# Patient Record
Sex: Male | Born: 1935 | Race: White | Hispanic: No | Smoking: Former smoker
Health system: Southern US, Community
[De-identification: ages and names within clinical notes are randomized; demographics above are authoritative.]

## PROBLEM LIST (undated history)

## (undated) DIAGNOSIS — E785 Hyperlipidemia, unspecified: Secondary | ICD-10-CM

## (undated) DIAGNOSIS — J449 Chronic obstructive pulmonary disease, unspecified: Secondary | ICD-10-CM

## (undated) DIAGNOSIS — I10 Essential (primary) hypertension: Secondary | ICD-10-CM

## (undated) DIAGNOSIS — E782 Mixed hyperlipidemia: Secondary | ICD-10-CM

## (undated) DIAGNOSIS — Z720 Tobacco use: Secondary | ICD-10-CM

## (undated) DIAGNOSIS — I714 Abdominal aortic aneurysm, without rupture, unspecified: Secondary | ICD-10-CM

## (undated) DIAGNOSIS — I2510 Atherosclerotic heart disease of native coronary artery without angina pectoris: Secondary | ICD-10-CM

## (undated) HISTORY — DX: Hyperlipidemia, unspecified: E78.5

## (undated) HISTORY — DX: Abdominal aortic aneurysm, without rupture, unspecified: I71.40

## (undated) HISTORY — DX: Chronic obstructive pulmonary disease, unspecified: J44.9

## (undated) HISTORY — DX: Essential (primary) hypertension: I10

## (undated) HISTORY — DX: Atherosclerotic heart disease of native coronary artery without angina pectoris: I25.10

## (undated) HISTORY — PX: TONSILLECTOMY: SUR1361

## (undated) HISTORY — DX: Mixed hyperlipidemia: E78.2

## (undated) HISTORY — DX: Abdominal aortic aneurysm, without rupture: I71.4

## (undated) HISTORY — DX: Tobacco use: Z72.0

---

## 1967-07-11 HISTORY — PX: HERNIA REPAIR: SHX51

## 2000-11-09 ENCOUNTER — Ambulatory Visit (HOSPITAL_COMMUNITY): Admission: RE | Admit: 2000-11-09 | Discharge: 2000-11-09 | Payer: Self-pay | Admitting: Cardiology

## 2000-11-09 ENCOUNTER — Encounter: Payer: Self-pay | Admitting: Cardiology

## 2000-11-09 HISTORY — PX: CORONARY ARTERY BYPASS GRAFT: SHX141

## 2000-11-13 ENCOUNTER — Encounter: Payer: Self-pay | Admitting: Thoracic Surgery (Cardiothoracic Vascular Surgery)

## 2000-11-13 ENCOUNTER — Inpatient Hospital Stay (HOSPITAL_COMMUNITY): Admission: AD | Admit: 2000-11-13 | Discharge: 2000-11-16 | Payer: Self-pay | Admitting: Cardiology

## 2000-11-14 ENCOUNTER — Encounter: Payer: Self-pay | Admitting: Thoracic Surgery (Cardiothoracic Vascular Surgery)

## 2000-11-15 ENCOUNTER — Encounter: Payer: Self-pay | Admitting: Thoracic Surgery (Cardiothoracic Vascular Surgery)

## 2000-11-28 ENCOUNTER — Emergency Department (HOSPITAL_COMMUNITY): Admission: EM | Admit: 2000-11-28 | Discharge: 2000-11-28 | Payer: Self-pay | Admitting: *Deleted

## 2000-11-28 ENCOUNTER — Encounter: Payer: Self-pay | Admitting: *Deleted

## 2000-12-05 ENCOUNTER — Ambulatory Visit (HOSPITAL_COMMUNITY): Admission: RE | Admit: 2000-12-05 | Discharge: 2000-12-05 | Payer: Self-pay | Admitting: Cardiology

## 2000-12-05 ENCOUNTER — Encounter: Payer: Self-pay | Admitting: Cardiology

## 2000-12-06 ENCOUNTER — Encounter (HOSPITAL_COMMUNITY): Admission: RE | Admit: 2000-12-06 | Discharge: 2001-01-05 | Payer: Self-pay | Admitting: Cardiology

## 2001-01-07 ENCOUNTER — Encounter (HOSPITAL_COMMUNITY): Admission: RE | Admit: 2001-01-07 | Discharge: 2001-02-06 | Payer: Self-pay | Admitting: Cardiology

## 2003-07-08 ENCOUNTER — Emergency Department (HOSPITAL_COMMUNITY): Admission: EM | Admit: 2003-07-08 | Discharge: 2003-07-08 | Payer: Self-pay | Admitting: Internal Medicine

## 2008-03-12 ENCOUNTER — Emergency Department (HOSPITAL_COMMUNITY): Admission: EM | Admit: 2008-03-12 | Discharge: 2008-03-12 | Payer: Self-pay | Admitting: Emergency Medicine

## 2009-10-12 ENCOUNTER — Encounter: Payer: Self-pay | Admitting: Gastroenterology

## 2009-11-08 ENCOUNTER — Ambulatory Visit: Payer: Self-pay | Admitting: Gastroenterology

## 2009-11-08 ENCOUNTER — Ambulatory Visit (HOSPITAL_COMMUNITY): Admission: RE | Admit: 2009-11-08 | Discharge: 2009-11-08 | Payer: Self-pay | Admitting: Gastroenterology

## 2009-11-10 ENCOUNTER — Telehealth (INDEPENDENT_AMBULATORY_CARE_PROVIDER_SITE_OTHER): Payer: Self-pay

## 2010-08-09 NOTE — Progress Notes (Signed)
Summary: phone note/ pt having HA, nausea, fever and weakness  Phone Note Call from Patient   Caller: Patient Summary of Call: Pt called to say he had TCS on 11/08/2009. He thinks he is having a reaction ???. He is having real bad headaches, nausea and  fever and weakness. I told him it sound like he is having flu/virus symptoms, but i would let Dr. Darrick Penna know. (He didn't have  polyps). Please advise. Initial call taken by: Cloria Spring LPN,  Nov 10, 1608 10:31 AM     Appended Document: phone note/ pt having HA, nausea, fever and weakness Called pt. Feels like he has the flu: HA, nausea off & on, subjective chills, abd pain mid abd pain, no diarrhea, no blood in stool. Mild abd pain on MON and then had other symptoms. Feels weak. BM: none since MON. Drinking fluids. Take Tylenol ATC reg 2 q6h for 2 days. Diet instruction given. Feels a little better. Pt will call FRi AM for update. If Sx worsen pt instructed to call me tomorrow.

## 2010-08-09 NOTE — Letter (Signed)
Summary: TRIAGE ORDER  TRIAGE ORDER   Imported By: Ave Filter 10/12/2009 16:47:08  _____________________________________________________________________  External Attachment:    Type:   Image     Comment:   External Document

## 2010-11-25 NOTE — Discharge Summary (Signed)
[place]  Patient:    Ricardo Castillo, Ricardo Castillo                          MRN: [id number] Adm. Date:  [id number] Disc. Date: [id number] Attending:  Tressie Stalker Dictator:   Carlye Grippe. CC:         [name], M.D.  [name], M.D.   Discharge Summary  HISTORY OF PRESENT ILLNESS:  This is a 75 year old male from [place], [place], with no previous cardiac history, however, several risk factors, including hyperlipidemia and previous history of tobacco abuse, who was initially referred to [name], M.D., with a one-year history of progressive angina and an abnormal electrocardiogram showing subtle ST segment changes in multiple leads.  He subsequently underwent an exercise treadmill test on Nov 07, 2000, which was positive for ST segment depression in the anterolateral leads and inferior leads with associated symptoms of chest pain during the study.  The patient was initially admitted for elective cardiac catheterization and further evaluation and treatment as dictated.  PAST MEDICAL HISTORY:  Heavy alcohol abuse in the past, but quit drinking more than 12 years ago.  He quit smoking 11 years ago.  He does have hyperlipidemia.  PAST SURGICAL HISTORY:  Previous hernia repair which was complicated by bleeding and subsequent infarction of the left testicle.  This was performed in 1969.  MEDICATIONS PRIOR TO THIS ADMISSION: 1. Toprol XL 25 mg q.d. 2. Baby aspirin one q.d.  ALLERGIES:  None known.  FAMILY HISTORY:  Please see the history and physical done at the time of admission.  SOCIAL HISTORY:  Please see the history and physical done at the time of admission.  REVIEW OF SYSTEMS:  Please see the history and physical done at the time of admission.  PHYSICAL EXAMINATION:  Please see the history and physical done at the time of admission.  HOSPITAL COURSE:  The patient was admitted for cardiac catheterization  and this demonstrated severe three-vessel disease with normal left ventricular function.  He was referred to [facility]. Cornelius Moras, M.D., for elective coronary revascularization.  Dr. Cornelius Moras evaluated the patient and his studies and agreed to proceed with the procedure.  The patient had a leave of absence and was readmitted on Nov 13, 2000, and he underwent the following procedure:  Coronary artery bypass grafting x 4.  The following grafts were placed:  1. Left internal mammary artery to the LAD.  2. Saphenous vein graft to the diagonal. 3. Saphenous vein graft to the first circumflex marginal branch.  4. Saphenous vein graft to the left posterolateral coronary artery.  The cross clamp time was 58 minutes and pump time 76 minutes.  The patient tolerated the procedure well and was taken to the surgical intensive care unit in stable condition.  POSTOPERATIVE HOSPITAL COURSE:  The patient has done well.  He has maintained excellent hemodynamics.  He was weaned from the ventilator without difficulty. All routine lines, monitors, and drainage tubes were discontinued in a routine stepwise manner.  The patient was advanced in a routine manner in regards to cardiac rehabilitation phase 1 modalities.  The patient has maintained normal sinus rhythm.  The patient has maintained good oxygen saturations and oxygen has been weaned off.  The patient has tolerated a gentle diuresis.  The patients incisions are healing well without signs of infection.  Overall he is felt to be quite stable for tentative discharge in  the morning on Nov 16, 2000, pending morning round re-evaluation.  MEDICATIONS ON DISCHARGE: 1. Toprol XL 25 mg q.d. 2. Aspirin 325 mg q.d. 3. Tylox one to two q.4-6h. p.r.n. as needed for pain. 4. Multivitamins one daily.  FINAL DIAGNOSES: 1. Coronary artery disease with class III angina, progressive three-vessel    disease by cardiac catheterization with normal left ventricular function. 2.  Remote tobacco abuse. 3. Remote alcohol abuse. 4. Previous left inguinal hernia repair with complications as noted above. 5. Hyperlipidemia. 6. Postoperative anemia.  The most recent hemoglobin and hematocrit dated    Nov 15, 2000, were 8.5 and 25.5, respectively, and this will be repeated    prior to discharge to make sure as to its stability.  DISCHARGE INSTRUCTIONS:  The patient will receive written instructions in regard to medications, activity, diet, wound care, and follow-up.  Follow-up will include [name], M.D., in two weeks, [facility]. Cornelius Moras, M.D., on Monday, December 10, 2000, at 10:15 a.m.  CONDITION ON DISCHARGE:  Stable and improved. DD:  11/15/00 TD:  11/17/00 Job: [id number]

## 2010-11-25 NOTE — Consult Note (Signed)
[facility]. [facility]  Patient:    Ricardo Castillo, Ricardo Castillo                          MRN: [id number] Proc. Date: 11/09/00 Adm. Date:  [id number] Attending:  Ophelia Shoulder CC:         [name], M.D.  [name], M.D.  CVTS office   Consultation Report  REQUESTING PHYSICIAN:  [name], M.D.  PRIMARY CARE PHYSICIAN:  [name], M.D.  REASON FOR CONSULTATION:  Severe three vessel coronary artery disease.  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  He was initially referred to Dr. Elsie Lincoln with a one year history of progressive exertional angina and an abnormal electrocardiogram demonstrating subtle ST segment changes in multiple leads.  He subsequently underwent an exercise treadmill test on May 1 which was positive for ST segment depression in the anterolateral leads and inferior leads with associated symptoms of chest pain during the study.  The patient subsequently underwent elective cardiac catheterization today by Dr. Elsie Lincoln.  This demonstrates severe three vessel coronary artery disease with normal left ventricular function.  He is referred for elective coronary revascularization.  REVIEW OF SYSTEMS:  Ricardo Castillo report he had a syncopal episode of chest pain occurring at rest approximately three or four weeks ago which resolved within 15 minutes after he took some aspirin.  Otherwise, he explicitly denies any previous episodes of chest pain occurring at rest.  He reports no problems with nocturnal angina.  He has mild dyspnea on exertion and he gets short of breath when he has symptoms of chest pain.  He denies any resting shortness of breath, PND, orthopnea, or lower extremity edema.  He has occasional palpitations, but denies syncopal episodes.  The rest of  his review of systems is notable for normal appetite with normal bowel function.  He denies any fever, chills, or productive cough.  He reports no hematochezia, hematemesis, or melena.  He denies any problems with claudication or history of transient monocular blindness or transient numbness or weakness involving either upper or lower extremity.  The remainder of his review of systems is essentially unrevealing.  PAST MEDICAL HISTORY:  Notable for the absence of any previously documented history of hypertension, diabetes, stroke, coronary artery disease.  He was recently diagnosed with mild hyperlipidemia.  The patient has history of heavy alcohol abuse in the past but he quit drinking more than 12 years ago.  He quit smoking 11 years ago.  PAST SURGICAL HISTORY:  Notable for hernia repair in 1969 complicated by bleeding and subsequently by infarction of the left testicle.  FAMILY HISTORY:  Notable for the absence of early onset coronary artery disease.  SOCIAL HISTORY:  Patient lives alone, but has a daughter who is nearby and very supportive.  He is divorced.  He has a history of heavy tobacco abuse, smoking more than two packs of cigarettes for many years, although he quit smoking approximately 11 years ago.  He quit drinking completely 12 years ago.  MEDICATIONS:  Prior to his recent cardiac catheterization include:  Toprol XL 25 mg q.d., baby aspirin q.d.  ALLERGIES:  Denies.  No sensitivities.  PHYSICAL EXAMINATION  GENERAL:  Well appearing thin white male who appears his stated age in no acute distress.  SKIN:  Clean and dry throughout.  VITAL SIGNS:  He is normotensive in sinus rhythm by monitor.  HEENT:  Essentially within normal limits.  NECK:  Supple.  There is no cervical or supraclavicular lymphadenopathy. There is no jugular venous distention.  No carotid bruits are noted.  CHEST:  Clear and symmetrical breath sounds bilaterally.  No wheezes or rhonchi are  noted.  CARDIOVASCULAR:  Regular rate and rhythm.  No murmurs, rubs, or gallops are identified.  ABDOMEN:  Soft and nontender.  The liver edge is not palpable.  No masses are identified.  EXTREMITIES:  Warm and well perfused.  There is no lower extremity edema. Distal pulses are palpable throughout.  Examination of the left forearm and hand demonstrates palpable ulnar and radial pulse.  Capillary refill appears intact in the left hand when the left radial artery is occluded.  A pulse oximetry probe was placed on the patients left index finger and ______ wave form is identified.  During transient compression of the left radial artery the wave form diminishes slightly, but insignificantly.  NEUROLOGIC:  Grossly nonfocal and symmetrical throughout.  RECTAL:  Deferred.  GENITOURINARY:  Deferred.  LABORATORIES:  Cardiac catheterization films performed today are reviewed. These demonstrate severe three vessel coronary artery disease with normal left ventricular function.  Specifically, there is calcification of the left main coronary artery as well as the proximal left anterior descending coronary artery.  There is 60-70% proximal stenosis of the left anterior descending coronary artery.  There is 70-80% proximal stenosis of the left circumflex coronary artery with 100% occlusion of the first circumflex marginal branch. This vessel GHOSTS in with left-to-left collateral filling.  There is 95% stenosis of the mid left circumflex coronary artery arising just before a small second circumflex marginal branch.  There is a very large left posterolateral branch which comes off the distal left circumflex coronary artery.  There is left dominant coronary circulation.  There is 90% proximal stenosis in a very small nondominant right coronary artery.   Laboratory data prior to cardiac catheterization included white blood count 5000, hemoglobin 15.6%, hematocrit 46.7%, platelet count 205,000.   Basic metabolic panel includes sodium 143, potassium 4.5, chloride 103, bicarbonate 25, BUN 14, creatinine 1.0, glucose 103.  Coagulation profile is normal with prothrombin time 12.0, INR 0.9, and PTT of 32 seconds.  IMPRESSION:  Severe three vessel coronary artery disease with normal left ventricular function and class III unstable angina.  PLAN:  I have outlined at length the indications, risks, and potential benefits of coronary artery bypass grafting with Mr. Goldie and his daughter. We have discussed alternative forms of therapy.  They accept all associated risks of surgery including, but not limited to, risk of death, stroke, myocardial infarction, bleeding requiring blood transfusion, arrhythmia, infection, and recurrent coronary artery disease.  We have also discussed the risks and benefits of utilization of the left radial artery as a conduit for bypass grafting.  Associated risks of this procedure are discussed.  All their questions have been addressed.  We tentatively plan to proceed with surgery on Tuesday, May 7. DD:  11/09/00 TD:  11/11/00 Job: [id number]

## 2010-11-25 NOTE — Cardiovascular Report (Signed)
[place]. [facility]  Patient:    Ricardo Castillo, Ricardo Castillo                          MRN: [id number] Proc. Date: 11/09/00 Adm. Date:  [id number] Attending:  Ophelia Shoulder                        Cardiac Catheterization  PROCEDURES PERFORMED: 1. Selective coronary angiography by Judkins technique. 2. Retrograde left heart catheterization. 3. Left ventricular angiography. 4. Abdominal aortography. 5. Selective visualization of an unbypassed internal mammary artery.  COMPLICATIONS:  None.  ENTRY SITE:  Right femoral artery.  DYE USED:  Omnipaque.  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  Two days ago, he presented to our [place] office for treadmill testing and within the second minute of exercise had mild chest discomfort and 2 mm of horizontal ST depression in his inferior and anterolateral leads.  The patient had quick resolution of pain within the first or second minute of recovery.  His ST segments took about 12-13 minutes to normalize.  Today, the patient enters the cardiac catheterization lab on an outpatient basis electively.  RESULTS: 1. The left main coronary artery appears normal. 2. The left anterior descending coronary artery contains calcification in the    proximal and mid portion of the vessel.  A diagonal arises before a first    septal perforator branch.  At that point in the vessel, there is an area of    50-60% stenosis.  The distal vessel is of good caliber and is an excellent    distal target for bypass surgery.  The first diagonal branch is a    relatively small vessel but is potentially bypassable. 3. The circumflex coronary artery is dominant, giving rise to a posterior    descending branch as well as a posterolateral branch. 4. The proximal circumflex contains a 75% stenosis.  The mid circumflex before    the  first obtuse marginal branch has a 95% eccentric Type C lesion.  The    distal circumflex contains an 80% stenosis.  The ostium of the first obtuse    marginal branch contains a 90% ostial stenosis with the distal vessel being    bypassable.  The posterolateral, posterior descending branches are    bypassable.  There is a 100% occluded intermedius ramus branch that fills    in a retrograde fashion by LAD to retrograde ramus collateral flow. 5. The right coronary artery is nondominant.  It is basically occluded with    intracoronary collateral flow.  I do not see much that is bypassable in    the right coronary artery.  The left ventricle shows excellent contractility with an ejection fraction estimate of 70%.  No evidence of old infarction is noted.  The left subclavian is normal in appearance.  The IMA is of good quality.  The abdominal aorta shows 30-40% stenosis in the mid portion.  No aneurysm. The distal runoff is good.  Both renals appear normal.  FINAL DIAGNOSES: 1. Early positive treadmill test with chronic stable angina. 2. Severe three-vessel coronary artery disease    a. A 60% proximal left anterior descending artery stenosis.    b. Three lesions in sequence in the dominant circumflex coronary artery       as described above. 3.  Basically occluded right coronary artery with intracoronary collaterals. 4. A 100% occlusion of the intermedius ramus branch which has retrograde    collateral flow. 5. Normal left ventricular systolic function.  PLAN:  CVTS surgical consultation. DD:  11/09/00 TD:  11/11/00 Job: [id number]

## 2010-11-25 NOTE — Op Note (Signed)
[place]  Patient:    Ricardo Castillo, Ricardo Castillo                          MRN: [id number] Proc. Date: 11/13/00 Adm. Date:  [id number] Attending:  Tressie Stalker CC:         [name], M.D.  Dr. [name]   Operative Report  PREOPERATIVE DIAGNOSIS:  Severe three-vessel coronary artery disease with class III unstable angina.  POSTOPERATIVE DIAGNOSIS:  Sever three-vessel coronary artery disease with class III unstable angina.  PROCEDURE:  Median sternotomy for coronary artery bypass grafting x 4 (left internal mammary artery to distal left anterior descending coronary artery, saphenous vein graft to first diagonal branch, saphenous vein graft to first circumflex marginal branch, saphenous vein graft to left posterolateral branch).  SURGEON:  Salvatore Decent. Cornelius Moras, M.D.  ASSISTANT:  Areta Haber, P.A.  ANESTHESIA:  General.  BRIEF CLINICAL NOTE:  The patient is a 75 year old male followed by Dr. [name], with a history of hyperlipidemia and a previous history of tobacco use.  He presents with a one-year history of progressive symptoms of exertional angina.  He underwent an exercise treadmill test on May 1, which was positive.  Cardiac catheterization performed by Dr. Elsie Lincoln demonstrates severe three-vessel coronary artery disease with normal left ventricular function.  A full consultation note has been dictated previously (dictation [id number]).  OPERATIVE CONSENT:  The patient and his family are counseled at length regarding the indications and potential benefits of coronary artery bypass grafting.  They understand the associated risks of surgery, including but not limited to risk of death, stroke, myocardial infarction, bleeding requiring blood transfusion, arrhythmia, infection, and recurrent coronary artery disease.  They accept these risks as well as any unforeseen complications and desire to proceed with surgery as  described.  Initially discussions were additionally held regarding the possibility of using the left radial artery as conduit for bypass grafting.  However, preoperative noninvasive vascular examination demonstrates diminished perfusion of the left index finger as measured with plethysmography during compression of the radial artery.  Therefore, use of the radial artery is felt to be relatively contraindicated.  The patient is informed on the morning of surgery.  DESCRIPTION OF PROCEDURE:  The patient is brought to the operating room on the above-mentioned date, and invasive hemodynamic monitoring is established by the anesthesia service under the care and direction of Dr. Katrinka Blazing.  The patient is placed in the supine position on the operating table.  Intravenous antibiotics are administered.  Following induction of general endotracheal anesthesia, the patients chest, abdomen, both groins, and both lower extremities are prepared and draped in a sterile manner.  A median sternotomy incision is performed, and the left internal mammary artery is dissected from the chest wall and prepared for bypass grafting.  The left internal mammary artery is felt to be good-quality conduit. Simultaneously, saphenous vein is obtained from the patients right lower extremity through a series of longitudinal incisions.  The patients saphenous vein is felt to be good-quality conduit.  The patient is heparinized systemically.  The pericardium is opened.  The ascending aorta is normal in appearance.  The ascending aorta and the right atrial appendage are cannulated for cardiopulmonary bypass.  Adequate heparinization is verified.  Cardiopulmonary bypass is begun, and the surface of the heart is inspected. Distal sites are selected for coronary bypass grafting.  Of note, the patient has left-dominant coronary circulation.  The distal right  coronary artery is a very small vessel and does not give off any  branches which supply the left ventricle.  There are no branches that are felt to be suitable for bypass grafting off the distal right coronary artery.  Portions of saphenous vein and the left internal mammary artery are trimmed to appropriate lengths.  A temperature probe is placed in the left ventricular septum, and a Styrofoam pad is placed to protect the left phrenic nerve from thermal injury.  A cardioplegia catheter is placed in the ascending aorta.  The patient is cooled to 32 degrees systemic temperature.  The aortic crossclamp is applied.  Cardioplegia is delivered in antegrade fashion through the aortic root.  Iced saline slush is applied for topical hypothermia.  The initial cardioplegic arrest and myocardial cooling are felt to be excellent. Repeat doses of cardioplegia are administered intermittently throughout the crossclamp portion of the operation, both through the aortic root and down subsequently-placed vein grafts to maintain septal temperature below 15 degrees Centigrade.  The following distal coronary anastomoses are performed: (1) The posterolateral branch off the distal left circumflex coronary artery is grafted with a saphenous vein graft in an end-to-side fashion.  This coronary measures 1.7 mm in diameter and is of good quality.  (2) The first circumflex marginal branch is grafted with a saphenous vein graft in an end-to-side fashion.  This coronary measures 1.3 mm in diameter and is of fair to good quality.  (3) The first diagonal branch off the left anterior descending coronary artery is grafted with a saphenous vein graft in an end-to-side fashion.  This coronary measures 1.3 mm in diameter and is of good quality.  (4) _____ left anterior descending coronary artery is grafted with the left internal mammary artery in an end-to-side fashion.  This coronary measures 1.7 mm in diameter and is of good quality.  All three proximal saphenous vein anastomoses are  performed directly to the ascending aorta prior  to removal of the aortic crossclamp.  The septal temperature is noted to rise rapidly and dramatically upon reperfusion of the left internal mammary artery. The aortic crossclamp is removed after a total crossclamp time of 58 minutes.  The heart begins to beat spontaneously without need for cardioversion.  The patient is rewarmed to greater than 37 degrees Centigrade temperature.  All proximal and distal anastomoses are inspected for hemostasis and appropriate graft orientation.  Epicardial pacing wires are fixed at the right ventricular outflow tract and to the right atrial appendage.  The patient is weaned from cardiopulmonary bypass without difficulty.  The patients rhythm at separation from bypass is normal sinus rhythm.  No inotropic support is required.  Total cardiopulmonary bypass time for the operation is 76 minutes.  The venous and arterial cannulae are removed uneventfully.  Protamine is administered to reverse the anticoagulation.  The mediastinum and the left chest are irrigated with saline solution containing vancomycin.  Meticulous surgical hemostasis is ascertained.  The mediastinum and the left chest are drained with three chest tubes placed through separate stab incisions inferiorly.  The median sternotomy is closed in routine fashion.  The right lower extremity incisions are closed in multiple layers in routine fashion.  All skin incisions are closed with subcuticular skin closures.  The patient tolerated the procedure well and is transported to the surgical intensive care unit in stable condition.  There were no intraoperative complications.  All sponge and instrument counts are verified correct at completion of the operation.  The needle count  was incorrect, but an x-ray was obtained, and there was no sign of any retained foreign bodies.  No autologous blood products were administered. DD:  11/13/00 TD:   11/13/00 Job: [id number]

## 2012-11-28 ENCOUNTER — Telehealth: Payer: Self-pay | Admitting: Internal Medicine

## 2012-12-04 ENCOUNTER — Telehealth: Payer: Self-pay | Admitting: Internal Medicine

## 2012-12-12 ENCOUNTER — Ambulatory Visit: Payer: Self-pay | Admitting: Internal Medicine

## 2012-12-12 ENCOUNTER — Ambulatory Visit (INDEPENDENT_AMBULATORY_CARE_PROVIDER_SITE_OTHER): Payer: Medicare Other | Admitting: Internal Medicine

## 2012-12-12 ENCOUNTER — Encounter: Payer: Self-pay | Admitting: Internal Medicine

## 2012-12-12 VITALS — BP 140/78 | Ht 69.0 in | Wt 164.0 lb

## 2012-12-12 DIAGNOSIS — Z951 Presence of aortocoronary bypass graft: Secondary | ICD-10-CM

## 2012-12-12 DIAGNOSIS — I5189 Other ill-defined heart diseases: Secondary | ICD-10-CM

## 2012-12-12 DIAGNOSIS — I519 Heart disease, unspecified: Secondary | ICD-10-CM

## 2012-12-12 DIAGNOSIS — I10 Essential (primary) hypertension: Secondary | ICD-10-CM

## 2012-12-12 DIAGNOSIS — E785 Hyperlipidemia, unspecified: Secondary | ICD-10-CM

## 2012-12-12 DIAGNOSIS — R0609 Other forms of dyspnea: Secondary | ICD-10-CM

## 2012-12-12 DIAGNOSIS — I25810 Atherosclerosis of coronary artery bypass graft(s) without angina pectoris: Secondary | ICD-10-CM

## 2012-12-12 DIAGNOSIS — I714 Abdominal aortic aneurysm, without rupture: Secondary | ICD-10-CM | POA: Insufficient documentation

## 2012-12-12 DIAGNOSIS — I2510 Atherosclerotic heart disease of native coronary artery without angina pectoris: Secondary | ICD-10-CM

## 2012-12-12 NOTE — Progress Notes (Signed)
OFFICE NOTE  Chief Complaint:  Routine followup  Primary Care Physician: Catalina Pizza, MD  HPI:  Ricardo Castillo is a 77 year old gentleman with a history of coronary artery disease status post CABG, a LIMA to the LAD, vein graft to the OM and posterior lateral branch. He had a new systolic murmur, which seemed to be out of proportion to his recent echocardiogram in August 2012. I was concerned about a prior catheterization in 2002, which indicated a 30 to 40% abdominal aortic stenosis and wondered if there was a possible aneurysm or bruit causing this murmur. I did send him for a Doppler of the aorta, which did demonstrate a small infrarenal abdominal aortic aneurysm measuring 3.46 x 3.42 cm. There was a mild amount of atherosclerosis visualized distally but not significant. It is recommended that this test is repeated in six months. He seems to be asymptomatic with that at this time, and he is not a smoker but quit in 1990. Blood pressure is well controlled today at 126/60 with a heart rate of 60; therefore, no additional medical therapy can be offered to slow the progression of this. Recent cholesterol profile showed a total cholesterol of 133, triglycerides 85, HDL 47, and LDL of 69. Overall is doing quite well, but is complaining of some shortness of breath with exertion which he thinks is becoming more frequent. He reported these symptoms are similar to those he had prior to his bypass surgery in 2002. He denies any chest pain.  PMHx:  Past Medical History  Diagnosis Date  . CAD (coronary artery disease)   . Hypertension   . Dyslipidemia   . History of stress test     negative for ischemia  . Murmur, heart     prominent, over left lower sternal border rib margin  . H/O echocardiogram 03/08/2011    EF greater than 55%; st. 1 diastolic dysfunction with only trace to milk MR, TR normal RVSP; sigmoid spetum without subvalvular gradient  . Diastolic dysfunction     with sigmoid septum     Past Surgical History  Procedure Laterality Date  . Coronary artery bypass graft  2002    post-CABG with LIMA to LAD & vein graft to OM & posterolateral branch  . Hernia repair  1969  . Tonsillectomy  age 9  . Cardiac catheterization  2002    Dr. Elsie Lincoln; 30-40% abdominal aortic stenosis without aneuysm    FAMHx:  No family history on file.  SOCHx:   reports that he quit smoking about 24 years ago. His smoking use included Cigarettes. He smoked 0.00 packs per day for 25 years. He does not have any smokeless tobacco history on file. He reports that he does not drink alcohol or use illicit drugs.  ALLERGIES:  No Known Allergies  ROS: A comprehensive review of systems was negative.  HOME MEDS: Current Outpatient Prescriptions  Medication Sig Dispense Refill  . aspirin 81 MG tablet Take 81 mg by mouth daily.      . finasteride (PROSCAR) 5 MG tablet Take 5 mg by mouth daily.      . Niacin CR 1000 MG TBCR Take 1,000 mg by mouth daily.      . simvastatin (ZOCOR) 40 MG tablet Take 40 mg by mouth every evening.      . terazosin (HYTRIN) 5 MG capsule Take 5 mg by mouth at bedtime.       No current facility-administered medications for this visit.    LABS/IMAGING: No results  found for this or any previous visit (from the past 48 hour(s)). No results found.  VITALS: BP 140/78  Ht 5\' 9"  (1.753 m)  Wt 164 lb (74.39 kg)  BMI 24.21 kg/m2  EXAM: General appearance: alert and no distress Neck: no adenopathy, no carotid bruit, no JVD, supple, symmetrical, trachea midline and thyroid not enlarged, symmetric, no tenderness/mass/nodules Lungs: clear to auscultation bilaterally Heart: systolic murmur: early systolic 3/6, crescendo at lower left sternal border Abdomen: soft, non-tender; bowel sounds normal; no masses,  no organomegaly Extremities: extremities normal, atraumatic, no cyanosis or edema Pulses: 2+ and symmetric Skin: Skin color, texture, turgor normal. No rashes or  lesions Neurologic: Grossly normal  EKG: Sinus bradycardia 56  ASSESSMENT: 1. The artery disease status post 3 vessel CABG (LIMA to LAD, SVG to OM and SVG to posterior lateral branch) in 2002 2. Infrarenal abdominal aortic aneurysm measuring 3.46 x 3.42 cm. 3. Dyslipidemia 4. Hypertension 5. Diastolic dysfunction with a sigmoid septum  PLAN: 1.   Mr. Rathert is doing well from a cardiovascular standpoint. He will need a followup ultrasound of his abdominal aorta to look for interval change in the size of this infrarenal aneurysm.  He is complaining of some worsening shortness of breath with activities. This did not sound particularly anginal, however prior to his bypass surgery he did report shortness of breath.  Therefore I would like him to undergo stress testing to rule out new ischemia, especially given the age of his bypass grafts.  No plan to see him back in a month to discuss results of the studies.  Chrystie Nose, MD, [facility] Attending Cardiologist [facility]  Ricardo Castillo 12/12/2012, 7:05 PM

## 2012-12-12 NOTE — Patient Instructions (Signed)
Follow-up in 2-3 weeks after tests.

## 2012-12-26 ENCOUNTER — Ambulatory Visit (HOSPITAL_BASED_OUTPATIENT_CLINIC_OR_DEPARTMENT_OTHER)
Admission: RE | Admit: 2012-12-26 | Discharge: 2012-12-26 | Disposition: A | Discharge status: Home / Self Care | Payer: Medicare Other | Source: Ambulatory Visit | Attending: Cardiovascular Disease | Admitting: Cardiovascular Disease

## 2012-12-26 ENCOUNTER — Ambulatory Visit (HOSPITAL_COMMUNITY)
Admission: RE | Admit: 2012-12-26 | Discharge: 2012-12-26 | Disposition: A | Discharge status: Home / Self Care | Payer: Medicare Other | Source: Ambulatory Visit | Attending: Cardiovascular Disease | Admitting: Cardiovascular Disease

## 2012-12-26 DIAGNOSIS — I2510 Atherosclerotic heart disease of native coronary artery without angina pectoris: Secondary | ICD-10-CM | POA: Insufficient documentation

## 2012-12-26 DIAGNOSIS — R0609 Other forms of dyspnea: Secondary | ICD-10-CM | POA: Insufficient documentation

## 2012-12-26 DIAGNOSIS — R0989 Other specified symptoms and signs involving the circulatory and respiratory systems: Secondary | ICD-10-CM | POA: Insufficient documentation

## 2012-12-26 DIAGNOSIS — I714 Abdominal aortic aneurysm, without rupture, unspecified: Secondary | ICD-10-CM

## 2012-12-26 DIAGNOSIS — Z9861 Coronary angioplasty status: Secondary | ICD-10-CM | POA: Insufficient documentation

## 2012-12-26 MED ORDER — AMINOPHYLLINE 25 MG/ML IV SOLN
75.0000 mg | Freq: Once | INTRAVENOUS | Status: AC
  Administered 2012-12-26: 75 mg via INTRAVENOUS

## 2012-12-26 MED ORDER — TECHNETIUM TC 99M SESTAMIBI GENERIC - CARDIOLITE
9.9000 | Freq: Once | INTRAVENOUS | Status: AC | PRN
  Administered 2012-12-26: 10 via INTRAVENOUS

## 2012-12-26 MED ORDER — REGADENOSON 0.4 MG/5ML IV SOLN
0.4000 mg | Freq: Once | INTRAVENOUS | Status: AC
  Administered 2012-12-26: 0.4 mg via INTRAVENOUS

## 2012-12-26 MED ORDER — TECHNETIUM TC 99M SESTAMIBI GENERIC - CARDIOLITE
31.3000 | Freq: Once | INTRAVENOUS | Status: AC | PRN
  Administered 2012-12-26: 31.3 via INTRAVENOUS

## 2012-12-26 NOTE — Progress Notes (Signed)
Abdominal Aortic Duplex Completed. °Ricardo Castillo ° °

## 2012-12-26 NOTE — Procedures (Addendum)
[facility] [address] [place] [address] [place] [address] [phone]  Cardiology Nuclear Med Study  Hamsa Laurich Leyh is a 77 y.o. male     MRN : [id number]     DOB: [birth year]  Procedure Date: 12/26/2012  Nuclear Med Background Indication for Stress Test:  Graft Patency History:  CAD;CABG X3--2002 Cardiac Risk Factors: History of Smoking, Hypertension, Lipids, PVD and AAA  Symptoms:  Dizziness, DOE, Fatigue, Light-Headedness and SOB   Nuclear Pre-Procedure Caffeine/Decaff Intake:  7:00pm NPO After: 5:00am   IV Site: R Forearm  IV 0.9% NS with Angio Cath:  22g  Chest Size (in):  40"  IV Started by: Emmit Pomfret, RN  Height: 5\' 9"  (1.753 m)  Cup Size: n/a  BMI:  Body mass index is 24.21 kg/(m^2). Weight:  164 lb (74.39 kg)   Tech Comments:  N/A     Nuclear Med Study 1 or 2 day study: 1 day  Stress Test Type:  Lexiscan  Order Authorizing Provider:  Zoila Shutter, MD   Resting Radionuclide: Technetium 86m Sestamibi  Resting Radionuclide Dose: 9.9 mCi   Stress Radionuclide:  Technetium 26m Sestamibi  Stress Radionuclide Dose: 31.3 mCi           Stress Protocol Rest HR: 55 Stress HR: 92  Rest BP: 138/72 Stress BP: 145/81  Exercise Time (min): n/a METS: n/a          Dose of Adenosine (mg):  n/a Dose of Lexiscan: 0.4 mg  Dose of Atropine (mg): n/a Dose of Dobutamine: n/a mcg/kg/min (at max HR)  Stress Test Technologist: Ernestene Mention, CCT Nuclear Technologist: Gonzella Lex, CNMT   Rest Procedure:  Myocardial perfusion imaging was performed at rest 45 minutes following the intravenous administration of Technetium 72m Sestamibi. Stress Procedure:  The patient received IV Lexiscan 0.4 mg over 15-seconds.  Technetium 27m Sestamibi injected at 30-seconds.  Due to patient's shortness of breath and head pain, he was given IV Aminophylline 75 mg. Symptoms were resolved during recovery. There were no significant changes with Lexiscan.   Quantitative spect images were obtained after a 45 minute delay.  Transient Ischemic Dilatation (Normal <1.22):  1.23 Lung/Heart Ratio (Normal <0.45):  0.27 QGS EDV:  65 ml QGS ESV:  28 ml LV Ejection Fraction: 65%  Rest ECG: NSR with non-specific ST-T wave changes  Stress ECG: No significant change from baseline ECG  QPS Raw Data Images:  Normal; no motion artifact; normal heart/lung ratio. Stress Images:  There is decreased uptake in the septum. Rest Images:  Normal homogeneous uptake in all areas of the myocardium. Subtraction (SDS):  No evidence of ischemia.  Small area of septal bowel artifact at stress.  Impression Exercise Capacity:  Lexiscan with no exercise. BP Response:  Normal blood pressure response. Clinical Symptoms:  There is dyspnea. ECG Impression:  No significant ECG changes with Lexiscan. Comparison with Prior Nuclear Study: No previous nuclear study performed  Overall Impression:  Low risk stress nuclear study with small area of bowel artifact in the stress images..  LV Wall Motion:  NL LV Function; NL Wall Motion; EF 56%.  Chrystie Nose, MD, [facility] Board Certified in Nuclear Cardiology Attending Cardiologist [facility]  Chrystie Nose, MD  12/26/2012 5:45 PM

## 2013-01-02 ENCOUNTER — Telehealth: Payer: Self-pay | Admitting: *Deleted

## 2013-01-02 NOTE — Telephone Encounter (Signed)
Result letter drafted and mailed to pt.  

## 2013-01-02 NOTE — Telephone Encounter (Signed)
Message copied by Chauncey Reading on Thu Jan 02, 2013  8:28 AM ------      Message from: Chrystie Nose      Created: Fri Dec 27, 2012  6:37 PM       Please notify patient that the nuclear stress test results were normal.            -Dr. Rennis Golden       ------

## 2013-01-14 ENCOUNTER — Encounter: Payer: Self-pay | Admitting: Internal Medicine

## 2013-01-14 ENCOUNTER — Ambulatory Visit (INDEPENDENT_AMBULATORY_CARE_PROVIDER_SITE_OTHER): Payer: Medicare Other | Admitting: Internal Medicine

## 2013-01-14 VITALS — BP 142/70 | Ht 69.0 in | Wt 162.4 lb

## 2013-01-14 DIAGNOSIS — R0609 Other forms of dyspnea: Secondary | ICD-10-CM

## 2013-01-14 DIAGNOSIS — I10 Essential (primary) hypertension: Secondary | ICD-10-CM

## 2013-01-14 DIAGNOSIS — I714 Abdominal aortic aneurysm, without rupture, unspecified: Secondary | ICD-10-CM

## 2013-01-14 DIAGNOSIS — Z951 Presence of aortocoronary bypass graft: Secondary | ICD-10-CM

## 2013-01-14 DIAGNOSIS — I2510 Atherosclerotic heart disease of native coronary artery without angina pectoris: Secondary | ICD-10-CM

## 2013-01-14 DIAGNOSIS — E785 Hyperlipidemia, unspecified: Secondary | ICD-10-CM

## 2013-01-14 DIAGNOSIS — R0989 Other specified symptoms and signs involving the circulatory and respiratory systems: Secondary | ICD-10-CM

## 2013-01-14 NOTE — Patient Instructions (Addendum)
Your physician wants you to follow-up in: 1 year. You will receive a reminder letter in the mail two months in advance. If you don't receive a letter, please call our office to schedule the follow-up appointment.  

## 2013-01-14 NOTE — Progress Notes (Signed)
OFFICE NOTE  Chief Complaint:  Routine followup  Primary Care Physician: Ricardo Pizza, MD  HPI:  Ricardo Castillo is a 77 year old gentleman with a history of coronary artery disease status post CABG, a LIMA to the LAD, vein graft to the OM and posterior lateral branch. He had a new systolic murmur, which seemed to be out of proportion to his recent echocardiogram in August 2012. I was concerned about a prior catheterization in 2002, which indicated a 30 to 40% abdominal aortic stenosis and wondered if there was a possible aneurysm or bruit causing this murmur. I did send him for a Doppler of the aorta, which did demonstrate a small infrarenal abdominal aortic aneurysm measuring 3.46 x 3.42 cm. There was a mild amount of atherosclerosis visualized distally but not significant. It is recommended that this test is repeated in six months. He seems to be asymptomatic with that at this time, and he is not a smoker but quit in 1990. Blood pressure is well controlled today at 126/60 with a heart rate of 60; therefore, no additional medical therapy can be offered to slow the progression of this. Recent cholesterol profile showed a total cholesterol of 133, triglycerides 85, HDL 47, and LDL of 69. Overall is doing quite well, but is complaining of some shortness of breath with exertion which he thinks is becoming more frequent. He reported these symptoms are similar to those he had prior to his bypass surgery in 2002. He denies any chest pain.    At his last visit, I recommended he undergo a nuclear stress test. The stress test was performed on 12/26/2012, and was negative for ischemia with an EF of 53%. He also underwent repeat aortoiliac Dopplers to evaluate for an infrarenal aneurysm. He had a known aneurysm which appears to be stable, measuring 3.2 x 3.35 cm, unchanged from previous study 12 months earlier.  With regards to shortness of breath, he says is pretty much stable. He thinks it is a combination of hot  weather and poor cardiovascular conditioning.  PMHx:  Past Medical History  Diagnosis Date  . CAD (coronary artery disease)   . Hypertension   . Dyslipidemia   . History of stress test     negative for ischemia  . Murmur, heart     prominent, over left lower sternal border rib margin  . H/O echocardiogram 03/08/2011    EF greater than 55%; st. 1 diastolic dysfunction with only trace to milk MR, TR normal RVSP; sigmoid spetum without subvalvular gradient  . Diastolic dysfunction     with sigmoid septum    Past Surgical History  Procedure Laterality Date  . Coronary artery bypass graft  2002    post-CABG with LIMA to LAD & vein graft to OM & posterolateral branch  . Hernia repair  1969  . Tonsillectomy  age 31  . Cardiac catheterization  2002    Dr. Elsie Castillo; 30-40% abdominal aortic stenosis without aneuysm    FAMHx:  No family history on file.  SOCHx:   reports that he quit smoking about 24 years ago. His smoking use included Cigarettes. He smoked 0.00 packs per day for 25 years. He does not have any smokeless tobacco history on file. He reports that he does not drink alcohol or use illicit drugs.  ALLERGIES:  No Known Allergies  ROS: A comprehensive review of systems was negative except for: Respiratory: positive for dyspnea on exertion  HOME MEDS: Current Outpatient Prescriptions  Medication Sig Dispense Refill  .  aspirin 81 MG tablet Take 81 mg by mouth daily.      . finasteride (PROSCAR) 5 MG tablet Take 5 mg by mouth daily.      . fish oil-omega-3 fatty acids 1000 MG capsule Take 1 g by mouth daily.      . Multiple Vitamin (MULTIVITAMIN WITH MINERALS) TABS Take 1 tablet by mouth daily.      . Niacin CR 1000 MG TBCR Take 1,000 mg by mouth daily.      . simvastatin (ZOCOR) 40 MG tablet Take 40 mg by mouth every evening.      . terazosin (HYTRIN) 5 MG capsule Take 5 mg by mouth at bedtime.       No current facility-administered medications for this visit.     LABS/IMAGING: No results found for this or any previous visit (from the past 48 hour(s)). No results found.  VITALS: BP 142/70  Ht 5\' 9"  (1.753 m)  Wt 162 lb 6.4 oz (73.664 kg)  BMI 23.97 kg/m2  EXAM: deferred  EKG: deferred  ASSESSMENT: 1. Coronary artery disease status post 3 vessel CABG (LIMA to LAD, SVG to OM and SVG to posterior lateral branch) in 2002 2. Infrarenal abdominal aortic aneurysm measuring 3.46 x 3.42 cm. 3. Dyslipidemia 4. Hypertension 5. Diastolic dysfunction with a sigmoid septum  PLAN: 1.   [name] stress test was negative for ischemia. EF is 53%. His shortness of breath is fairly stable, and probably represents cardiovascular deconditioning orad affects of his long-time smoking. We could consider pulmonary function testing if his symptoms worsen. Because low risk stress test, recommend that he increase his cardiovascular exercise. Work on cardiovascular conditioning. His infrarenal aneurysm is stable and we'll monitor that every one to 2 years. We will see him back annually.  Ricardo Nose, MD, [facility] Attending Cardiologist [facility]  Ricardo Castillo 01/14/2013, 8:16 AM

## 2013-02-12 ENCOUNTER — Other Ambulatory Visit: Payer: Self-pay

## 2013-05-15 ENCOUNTER — Other Ambulatory Visit: Payer: Self-pay

## 2013-10-29 NOTE — Telephone Encounter (Signed)
Encounter has been closed--TP 10/29/13 

## 2013-10-29 NOTE — Telephone Encounter (Signed)
Closed encounter---per [name]

## 2013-11-21 ENCOUNTER — Ambulatory Visit (INDEPENDENT_AMBULATORY_CARE_PROVIDER_SITE_OTHER): Payer: Medicare Other | Admitting: Cardiology

## 2013-11-21 ENCOUNTER — Encounter: Payer: Self-pay | Admitting: Cardiology

## 2013-11-21 VITALS — BP 144/73 | HR 70 | Ht 69.0 in | Wt 164.0 lb

## 2013-11-21 DIAGNOSIS — I714 Abdominal aortic aneurysm, without rupture, unspecified: Secondary | ICD-10-CM

## 2013-11-21 DIAGNOSIS — E782 Mixed hyperlipidemia: Secondary | ICD-10-CM

## 2013-11-21 DIAGNOSIS — I10 Essential (primary) hypertension: Secondary | ICD-10-CM

## 2013-11-21 DIAGNOSIS — I2510 Atherosclerotic heart disease of native coronary artery without angina pectoris: Secondary | ICD-10-CM

## 2013-11-21 NOTE — Progress Notes (Signed)
Clinical Summary [name] is a 78 y.o.male presenting for cardiology followup, a former patient of Dr. Rennis Castillo last seen in July 2014. This is our first meeting. History is outlined below.  He reports no angina symptoms, no nitroglycerin use, stable NYHA class II dyspnea. He enjoys working outdoors, including splitting wood, also working in his garden.  Most recent ischemic testing was via Lexiscan Cardiolite in June 2014 demonstrating no evidence of ischemia with LVEF 53%. Abdominal ultrasound also in June 2014 showed stable AAA measuring 3.2 x 3.3 cm.  ECG today shows sinus rhythm with inferolateral ST-T wave abnormalities, possible repolarization changes in the absence of symptoms.  Lipids are followed by Dr. Margo Castillo. Patient continues on Zocor.   No Known Allergies  Current Outpatient Prescriptions  Medication Sig Dispense Refill  . aspirin 81 MG tablet Take 81 mg by mouth daily.      . finasteride (PROSCAR) 5 MG tablet Take 5 mg by mouth daily.      . fish oil-omega-3 fatty acids 1000 MG capsule Take 1 g by mouth daily.      . Multiple Vitamin (MULTIVITAMIN WITH MINERALS) TABS Take 1 tablet by mouth daily.      . Niacin CR 1000 MG TBCR Take 1,000 mg by mouth daily.      . simvastatin (ZOCOR) 40 MG tablet Take 40 mg by mouth every evening.      . terazosin (HYTRIN) 5 MG capsule Take 5 mg by mouth at bedtime.       No current facility-administered medications for this visit.    Past Medical History  Diagnosis Date  . Coronary atherosclerosis of native coronary artery     Multivessel status post CABG  . Essential hypertension, benign   . Mixed hyperlipidemia   . Abdominal aortic aneurysm     Infrarenal 3.46 x 3.42 cm    Past Surgical History  Procedure Laterality Date  . Coronary artery bypass graft  2002    LIMA to LAD, SVG OM and PLB  . Hernia repair  1969  . Tonsillectomy  age 89    Social History [name] reports that he quit smoking about 25 years ago. His smoking  use included Cigarettes. He smoked 0.00 packs per day for 25 years. He does not have any smokeless tobacco history on file. [name] reports that he does not drink alcohol.  Review of Systems Denies any abdominal pain, orthopnea or PND. No claudication. No palpitations or syncope. Otherwise negative.  Physical Examination Filed Vitals:   11/21/13 1012  BP: 144/73  Pulse: 70   Filed Weights   11/21/13 1012  Weight: 164 lb (74.39 kg)   Patient appears comfortable at rest. HEENT: Conjunctiva and lids normal, oropharynx clear. Neck: Supple, no elevated JVP or carotid bruits, no thyromegaly. Lungs: Clear to auscultation, nonlabored breathing at rest. Cardiac: Regular rate and rhythm, no S3, 2/6 apical systolic murmur, no pericardial rub. Abdomen: Soft, nontender, bowel sounds present, no guarding or rebound. Extremities: No pitting edema, distal pulses 2+. Skin: Warm and dry. Musculoskeletal: No kyphosis. Neuropsychiatric: Alert and oriented x3, affect grossly appropriate.   Problem List and Plan   Coronary atherosclerosis of native coronary artery Symptomatically stable on medical therapy with history of multivessel CAD status post CABG as outlined above. He underwent reassuring ischemic testing within the last year, ECG reviewed. Plan will be to continue observation. Followup arranged in one year, sooner if symptoms intervene.  AAA (abdominal aortic aneurysm) Followup abdominal ultrasound.  Hyperlipidemia On Zocor, followed by Dr. Margo Castillo. Would aim for LDL around 70 if possible.  Essential hypertension, benign Blood pressure mildly elevated today. Keep follow up with Dr. Margo Castillo.    Ricardo Castillo, M.D., F.A.C.C.

## 2013-11-21 NOTE — Assessment & Plan Note (Signed)
Follow up abdominal ultrasound.

## 2013-11-21 NOTE — Assessment & Plan Note (Signed)
Blood pressure mildly elevated today. Keep follow up with Dr. Margo AyeHall.

## 2013-11-21 NOTE — Patient Instructions (Addendum)
Your physician recommends that you schedule a follow-up appointment in: 1 year with Dr Randa SpikeMcDowell You will receive a reminder letter two months in advance reminding you to call and schedule your appointment. If you don't receive this letter, please contact our office  Your physician recommends that you continue on your current medications as directed. Please refer to the Current Medication list given to you today.   Your physician has requested that you have an abdominal aorta duplex. During this test, an ultrasound is used to evaluate the aorta. Allow 30 minutes for this exam. Do not eat after midnight the day before and avoid carbonated beverages    Thank you for choosing [facility] !

## 2013-11-21 NOTE — Assessment & Plan Note (Signed)
On Zocor, followed by Dr. Margo AyeHall. Would aim for LDL around 70 if possible.

## 2013-11-21 NOTE — Assessment & Plan Note (Signed)
Symptomatically stable on medical therapy with history of multivessel CAD status post CABG as outlined above. He underwent reassuring ischemic testing within the last year, ECG reviewed. Plan will be to continue observation. Followup arranged in one year, sooner if symptoms intervene.

## 2014-01-12 ENCOUNTER — Ambulatory Visit (HOSPITAL_COMMUNITY)
Admission: RE | Admit: 2014-01-12 | Discharge: 2014-01-12 | Disposition: A | Discharge status: Home / Self Care | Payer: Medicare Other | Source: Ambulatory Visit | Attending: Cardiology | Admitting: Cardiology

## 2014-01-12 DIAGNOSIS — I723 Aneurysm of iliac artery: Secondary | ICD-10-CM | POA: Insufficient documentation

## 2014-01-12 DIAGNOSIS — I714 Abdominal aortic aneurysm, without rupture, unspecified: Secondary | ICD-10-CM | POA: Insufficient documentation

## 2014-01-12 DIAGNOSIS — I2510 Atherosclerotic heart disease of native coronary artery without angina pectoris: Secondary | ICD-10-CM

## 2014-05-13 ENCOUNTER — Encounter (HOSPITAL_COMMUNITY): Payer: Self-pay

## 2014-05-13 ENCOUNTER — Encounter (HOSPITAL_COMMUNITY)
Admission: RE | Admit: 2014-05-13 | Discharge: 2014-05-13 | Disposition: A | Discharge status: Home / Self Care | Payer: Medicare Other | Source: Ambulatory Visit | Attending: Ophthalmology | Admitting: Ophthalmology

## 2014-05-13 DIAGNOSIS — Z01812 Encounter for preprocedural laboratory examination: Secondary | ICD-10-CM | POA: Insufficient documentation

## 2014-05-13 LAB — BASIC METABOLIC PANEL
ANION GAP: 9 (ref 5–15)
BUN: 14 mg/dL (ref 6–23)
CALCIUM: 10 mg/dL (ref 8.4–10.5)
CO2: 28 mEq/L (ref 19–32)
CREATININE: 1.1 mg/dL (ref 0.50–1.35)
Chloride: 104 mEq/L (ref 96–112)
GFR calc non Af Amer: 63 mL/min — ABNORMAL LOW (ref >90)
GFR, EST AFRICAN AMERICAN: 73 mL/min — AB (ref >90)
Glucose, Bld: 88 mg/dL (ref 70–99)
Potassium: 4.5 mEq/L (ref 3.7–5.3)
Sodium: 141 mEq/L (ref 137–147)

## 2014-05-13 LAB — HEMOGLOBIN AND HEMATOCRIT, BLOOD
HEMATOCRIT: 40.5 % (ref 39.0–52.0)
HEMOGLOBIN: 14.1 g/dL (ref 13.0–17.0)

## 2014-05-13 NOTE — Patient Instructions (Signed)
Olegario P Shontz  05/13/2014   Your procedure is scheduled on:  05/18/14 [facility]  Call this number if you have problems the morning of surgery: [phone]   Remember:   Do not eat food or drink liquids after midnight.   Take these medicines the morning of surgery with A SIP OF WATER: hytrin   Do not wear jewelry, make-up or nail polish.  Do not wear lotions, powders, or perfumes. You may wear deodorant.  Do not shave 48 hours prior to surgery. Men may shave face and neck.  Do not bring valuables to the hospital.  [facility]                  for any belongings or valuables.               Contacts, dentures or bridgework may not be worn into surgery.  Leave suitcase in the car. After surgery it may be brought to your room.  For patients admitted to the hospital, discharge time is determined by your                treatment team.               Patients discharged the day of surgery will not be allowed to drive  home.  Name and phone number of your driver: family  Special Instructions: N/A   Please read over the following fact sheets that you were given: Pain Booklet, Surgical Site Infection Prevention, Anesthesia Post-op Instructions and Care and Recovery After Surgery    PATIENT INSTRUCTIONS POST-ANESTHESIA  IMMEDIATELY FOLLOWING SURGERY:  Do not drive or operate machinery for the first twenty four hours after surgery.  Do not make any important decisions for twenty four hours after surgery or while taking narcotic pain medications or sedatives.  If you develop intractable nausea and vomiting or a severe headache please notify your doctor immediately.  FOLLOW-UP:  Please make an appointment with your surgeon as instructed. You do not need to follow up with anesthesia unless specifically instructed to do so.  WOUND CARE INSTRUCTIONS (if applicable):  Keep a dry clean dressing on the anesthesia/puncture wound site if there is drainage.  Once  the wound has quit draining you may leave it open to air.  Generally you should leave the bandage intact for twenty four hours unless there is drainage.  If the epidural site drains for more than 36-48 hours please call the anesthesia department.  QUESTIONS?:  Please feel free to call your physician or the hospital operator if you have any questions, and they will be happy to assist you.      Cataract A cataract is a clouding of the lens of the eye. When a lens becomes cloudy, vision is reduced based on the degree and nature of the clouding. Many cataracts reduce vision to some degree. Some cataracts make people more near-sighted as they develop. Other cataracts increase glare. Cataracts that are ignored and become worse can sometimes look white. The white color can be seen through the pupil. CAUSES   Aging. However, cataracts may occur at any age, even in newborns.  Certain drugs.  Trauma to the eye.  Certain diseases such as diabetes.  Specific eye diseases such as chronic inflammation inside the eye or a sudden attack of a rare form of glaucoma.  Inherited or acquired medical problems. SYMPTOMS   Gradual, progressive drop in vision in the affected  eye.  Severe, rapid visual loss. This most often happens when trauma is the cause. DIAGNOSIS  To detect a cataract, an eye doctor examines the lens. Cataracts are best diagnosed with an exam of the eyes with the pupils enlarged (dilated) by drops.  TREATMENT  For an early cataract, vision may improve by using different eyeglasses or stronger lighting. If that does not help your vision, surgery is the only effective treatment. A cataract needs to be surgically removed when vision loss interferes with your everyday activities, such as driving, reading, or watching TV. A cataract may also have to be removed if it prevents examination or treatment of another eye problem. Surgery removes the cloudy lens and usually replaces it with a substitute  lens (intraocular lens, IOL).  At a time when both you and your doctor agree, the cataract will be surgically removed. If you have cataracts in both eyes, only one is usually removed at a time. This allows the operated eye to heal and be out of danger from any possible problems after surgery (such as infection or poor wound healing). In rare cases, a cataract may be doing damage to your eye. In these cases, your caregiver may advise surgical removal right away. The vast majority of people who have cataract surgery have better vision afterward. HOME CARE INSTRUCTIONS  If you are not planning surgery, you may be asked to do the following:  Use different eyeglasses.  Use stronger or brighter lighting.  Ask your eye doctor about reducing your medicine dose or changing medicines if it is thought that a medicine caused your cataract. Changing medicines does not make the cataract go away on its own.  Become familiar with your surroundings. Poor vision can lead to injury. Avoid bumping into things on the affected side. You are at a higher risk for tripping or falling.  Exercise extreme care when driving or operating machinery.  Wear sunglasses if you are sensitive to bright light or experiencing problems with glare. SEEK IMMEDIATE MEDICAL CARE IF:   You have a worsening or sudden vision loss.  You notice redness, swelling, or increasing pain in the eye.  You have a fever. Document Released: 06/26/2005 Document Revised: 09/18/2011 Document Reviewed: 02/17/2011 [facility] Patient Information 2015 [place]. This information is not intended to replace advice given to you by your health care provider. Make sure you discuss any questions you have with your health care provider.

## 2014-05-15 MED ORDER — CYCLOPENTOLATE-PHENYLEPHRINE OP SOLN OPTIME - NO CHARGE
OPHTHALMIC | Status: AC
  Filled 2014-05-15: qty 2

## 2014-05-15 MED ORDER — NEOMYCIN-POLYMYXIN-DEXAMETH 3.5-10000-0.1 OP SUSP
OPHTHALMIC | Status: AC
  Filled 2014-05-15: qty 5

## 2014-05-15 MED ORDER — PHENYLEPHRINE HCL 2.5 % OP SOLN
OPHTHALMIC | Status: AC
  Filled 2014-05-15: qty 15

## 2014-05-15 MED ORDER — TETRACAINE HCL 0.5 % OP SOLN
OPHTHALMIC | Status: AC
  Filled 2014-05-15: qty 2

## 2014-05-15 MED ORDER — LIDOCAINE HCL (PF) 1 % IJ SOLN
INTRAMUSCULAR | Status: AC
  Filled 2014-05-15: qty 2

## 2014-05-15 MED ORDER — LIDOCAINE HCL 3.5 % OP GEL
OPHTHALMIC | Status: AC
  Filled 2014-05-15: qty 1

## 2014-05-18 ENCOUNTER — Encounter (HOSPITAL_COMMUNITY): Payer: Self-pay | Admitting: Anesthesiology

## 2014-05-18 ENCOUNTER — Ambulatory Visit (HOSPITAL_COMMUNITY)
Admission: RE | Admit: 2014-05-18 | Discharge: 2014-05-18 | Disposition: A | Discharge status: Home / Self Care | Payer: Medicare Other | Source: Ambulatory Visit | Attending: Ophthalmology | Admitting: Ophthalmology

## 2014-05-18 ENCOUNTER — Encounter (HOSPITAL_COMMUNITY)
Admission: RE | Disposition: A | Discharge status: Home / Self Care | Payer: Self-pay | Source: Ambulatory Visit | Attending: Ophthalmology

## 2014-05-18 ENCOUNTER — Ambulatory Visit (HOSPITAL_COMMUNITY): Payer: Medicare Other | Admitting: Anesthesiology

## 2014-05-18 DIAGNOSIS — H2511 Age-related nuclear cataract, right eye: Secondary | ICD-10-CM | POA: Insufficient documentation

## 2014-05-18 DIAGNOSIS — I2510 Atherosclerotic heart disease of native coronary artery without angina pectoris: Secondary | ICD-10-CM | POA: Insufficient documentation

## 2014-05-18 DIAGNOSIS — I739 Peripheral vascular disease, unspecified: Secondary | ICD-10-CM | POA: Diagnosis not present

## 2014-05-18 DIAGNOSIS — Z87891 Personal history of nicotine dependence: Secondary | ICD-10-CM | POA: Insufficient documentation

## 2014-05-18 DIAGNOSIS — I10 Essential (primary) hypertension: Secondary | ICD-10-CM | POA: Diagnosis not present

## 2014-05-18 HISTORY — PX: CATARACT EXTRACTION W/PHACO: SHX586

## 2014-05-18 SURGERY — PHACOEMULSIFICATION, CATARACT, WITH IOL INSERTION
Anesthesia: Monitor Anesthesia Care | Site: Eye | Laterality: Right

## 2014-05-18 MED ORDER — EPINEPHRINE HCL 1 MG/ML IJ SOLN
INTRAMUSCULAR | Status: AC
  Filled 2014-05-18: qty 1

## 2014-05-18 MED ORDER — CYCLOPENTOLATE-PHENYLEPHRINE 0.2-1 % OP SOLN
1.0000 [drp] | OPHTHALMIC | Status: AC
  Administered 2014-05-18 (×3): 1 [drp] via OPHTHALMIC

## 2014-05-18 MED ORDER — MIDAZOLAM HCL 2 MG/2ML IJ SOLN
1.0000 mg | INTRAMUSCULAR | Status: DC | PRN
Start: 2014-05-18 — End: 2014-05-18
  Administered 2014-05-18: 2 mg via INTRAVENOUS

## 2014-05-18 MED ORDER — LIDOCAINE 3.5 % OP GEL OPTIME - NO CHARGE
OPHTHALMIC | Status: DC | PRN
  Administered 2014-05-18: 1 [drp] via OPHTHALMIC

## 2014-05-18 MED ORDER — LIDOCAINE HCL (PF) 1 % IJ SOLN
INTRAOCULAR | Status: DC | PRN
  Administered 2014-05-18: .7 mL via OPHTHALMIC

## 2014-05-18 MED ORDER — PROVISC 10 MG/ML IO SOLN
INTRAOCULAR | Status: DC | PRN
  Administered 2014-05-18: 0.85 mL via INTRAOCULAR

## 2014-05-18 MED ORDER — MIDAZOLAM HCL 2 MG/2ML IJ SOLN
INTRAMUSCULAR | Status: AC
  Filled 2014-05-18: qty 2

## 2014-05-18 MED ORDER — PHENYLEPHRINE HCL 2.5 % OP SOLN
1.0000 [drp] | OPHTHALMIC | Status: AC
Start: 2014-05-18 — End: 2014-05-18
  Administered 2014-05-18 (×3): 1 [drp] via OPHTHALMIC

## 2014-05-18 MED ORDER — FENTANYL CITRATE 0.05 MG/ML IJ SOLN
INTRAMUSCULAR | Status: AC
  Filled 2014-05-18: qty 2

## 2014-05-18 MED ORDER — ONDANSETRON HCL 4 MG/2ML IJ SOLN
4.0000 mg | Freq: Once | INTRAMUSCULAR | Status: AC
  Administered 2014-05-18: 4 mg via INTRAVENOUS

## 2014-05-18 MED ORDER — POVIDONE-IODINE 5 % OP SOLN
OPHTHALMIC | Status: DC | PRN
  Administered 2014-05-18: 1 via OPHTHALMIC

## 2014-05-18 MED ORDER — TETRACAINE HCL 0.5 % OP SOLN
1.0000 [drp] | OPHTHALMIC | Status: AC
Start: 2014-05-18 — End: 2014-05-18
  Administered 2014-05-18 (×3): 1 [drp] via OPHTHALMIC

## 2014-05-18 MED ORDER — EPINEPHRINE HCL 1 MG/ML IJ SOLN
INTRAOCULAR | Status: DC | PRN
  Administered 2014-05-18: 500 mL

## 2014-05-18 MED ORDER — FENTANYL CITRATE 0.05 MG/ML IJ SOLN
25.0000 ug | INTRAMUSCULAR | Status: AC
  Administered 2014-05-18 (×2): 25 ug via INTRAVENOUS

## 2014-05-18 MED ORDER — LIDOCAINE HCL 3.5 % OP GEL
1.0000 "application " | Freq: Once | OPHTHALMIC | Status: AC
  Administered 2014-05-18: 1 via OPHTHALMIC

## 2014-05-18 MED ORDER — LACTATED RINGERS IV SOLN
INTRAVENOUS | Status: DC
  Administered 2014-05-18: 1000 mL via INTRAVENOUS

## 2014-05-18 MED ORDER — BSS IO SOLN
INTRAOCULAR | Status: DC | PRN
  Administered 2014-05-18: 15 mL

## 2014-05-18 MED ORDER — ONDANSETRON HCL 4 MG/2ML IJ SOLN
INTRAMUSCULAR | Status: AC
  Filled 2014-05-18: qty 2

## 2014-05-18 MED ORDER — NEOMYCIN-POLYMYXIN-DEXAMETH 3.5-10000-0.1 OP SUSP
OPHTHALMIC | Status: DC | PRN
  Administered 2014-05-18: 2 [drp] via OPHTHALMIC

## 2014-05-18 SURGICAL SUPPLY — 34 items
CAPSULAR TENSION RING-AMO (OPHTHALMIC RELATED) IMPLANT
CLOTH BEACON ORANGE TIMEOUT ST (SAFETY) ×3 IMPLANT
EYE SHIELD UNIVERSAL CLEAR (GAUZE/BANDAGES/DRESSINGS) ×3 IMPLANT
GLOVE BIO SURGEON STRL SZ 6.5 (GLOVE) IMPLANT
GLOVE BIO SURGEONS STRL SZ 6.5 (GLOVE)
GLOVE BIOGEL PI IND STRL 6.5 (GLOVE) ×2 IMPLANT
GLOVE BIOGEL PI IND STRL 7.0 (GLOVE) ×1 IMPLANT
GLOVE BIOGEL PI IND STRL 7.5 (GLOVE) IMPLANT
GLOVE BIOGEL PI INDICATOR 6.5 (GLOVE) ×4
GLOVE BIOGEL PI INDICATOR 7.0 (GLOVE) ×2
GLOVE BIOGEL PI INDICATOR 7.5 (GLOVE)
GLOVE ECLIPSE 6.5 STRL STRAW (GLOVE) IMPLANT
GLOVE ECLIPSE 7.0 STRL STRAW (GLOVE) IMPLANT
GLOVE ECLIPSE 7.5 STRL STRAW (GLOVE) IMPLANT
GLOVE EXAM NITRILE LRG STRL (GLOVE) IMPLANT
GLOVE EXAM NITRILE MD LF STRL (GLOVE) IMPLANT
GLOVE SKINSENSE NS SZ6.5 (GLOVE)
GLOVE SKINSENSE NS SZ7.0 (GLOVE)
GLOVE SKINSENSE STRL SZ6.5 (GLOVE) IMPLANT
GLOVE SKINSENSE STRL SZ7.0 (GLOVE) IMPLANT
KIT VITRECTOMY (OPHTHALMIC RELATED) IMPLANT
PAD ARMBOARD 7.5X6 YLW CONV (MISCELLANEOUS) ×3 IMPLANT
PROC W NO LENS (INTRAOCULAR LENS)
PROC W SPEC LENS (INTRAOCULAR LENS)
PROCESS W NO LENS (INTRAOCULAR LENS) IMPLANT
PROCESS W SPEC LENS (INTRAOCULAR LENS) IMPLANT
RETRACTOR IRIS SIGHTPATH (OPHTHALMIC RELATED) IMPLANT
RING MALYGIN (MISCELLANEOUS) IMPLANT
SIGHTPATH CAT PROC W REG LENS (Ophthalmic Related) ×3 IMPLANT
SYRINGE LUER LOK 1CC (MISCELLANEOUS) ×3 IMPLANT
TAPE SURG TRANSPORE 1 IN (GAUZE/BANDAGES/DRESSINGS) ×1 IMPLANT
TAPE SURGICAL TRANSPORE 1 IN (GAUZE/BANDAGES/DRESSINGS) ×2
VISCOELASTIC ADDITIONAL (OPHTHALMIC RELATED) IMPLANT
WATER STERILE IRR 250ML POUR (IV SOLUTION) ×3 IMPLANT

## 2014-05-18 NOTE — Anesthesia Preprocedure Evaluation (Signed)
Anesthesia Evaluation  Patient identified by MRN, date of birth, ID band Patient awake    Reviewed: Allergy & Precautions, H&P , NPO status , Patient's Chart, lab work & pertinent test results  Airway Mallampati: II  TM Distance: >3 FB     Dental  (+) Edentulous Upper, Edentulous Lower   Pulmonary former smoker,  breath sounds clear to auscultation        Cardiovascular hypertension, Pt. on medications + CAD and + Peripheral Vascular Disease Rhythm:Regular Rate:Normal     Neuro/Psych    GI/Hepatic negative GI ROS,   Endo/Other    Renal/GU      Musculoskeletal   Abdominal   Peds  Hematology   Anesthesia Other Findings   Reproductive/Obstetrics                             Anesthesia Physical Anesthesia Plan  ASA: III  Anesthesia Plan: MAC   Post-op Pain Management:    Induction: Intravenous  Airway Management Planned: Nasal Cannula  Additional Equipment:   Intra-op Plan:   Post-operative Plan:   Informed Consent: I have reviewed the patients History and Physical, chart, labs and discussed the procedure including the risks, benefits and alternatives for the proposed anesthesia with the patient or authorized representative who has indicated his/her understanding and acceptance.     Plan Discussed with:   Anesthesia Plan Comments:         Anesthesia Quick Evaluation

## 2014-05-18 NOTE — Anesthesia Postprocedure Evaluation (Signed)
  Anesthesia Post-op Note  Patient: Ricardo Castillo  Procedure(s) Performed: Procedure(s) with comments: CATARACT EXTRACTION PHACO AND INTRAOCULAR LENS PLACEMENT (IOC) (Right) - CDE 19.59  Patient Location: Short Stay  Anesthesia Type:MAC  Level of Consciousness: awake, alert  and oriented  Airway and Oxygen Therapy: Patient Spontanous Breathing  Post-op Pain: none  Post-op Assessment: Post-op Vital signs reviewed, Patient's Cardiovascular Status Stable, Respiratory Function Stable, Patent Airway and No signs of Nausea or vomiting  Post-op Vital Signs: Reviewed and stable  Last Vitals:  Filed Vitals:   05/18/14 1120  BP: 94/52  Pulse:   Temp:   Resp: 36    Complications: No apparent anesthesia complications

## 2014-05-18 NOTE — Transfer of Care (Signed)
Immediate Anesthesia Transfer of Care Note  Patient: Ricardo Castillo  Procedure(s) Performed: Procedure(s) with comments: CATARACT EXTRACTION PHACO AND INTRAOCULAR LENS PLACEMENT (IOC) (Right) - CDE 19.59  Patient Location: Short Stay  Anesthesia Type:MAC  Level of Consciousness: awake  Airway & Oxygen Therapy: Patient Spontanous Breathing  Post-op Assessment: Report given to PACU RN  Post vital signs: Reviewed  Complications: No apparent anesthesia complications

## 2014-05-18 NOTE — Op Note (Signed)
Date of Admission: 05/18/2014  Date of Surgery: 05/18/2014   Pre-Op Dx: Cataract Right Eye  Post-Op Dx: Senile Nuclear Cataract Right  Eye,  Dx Code H25.11  Surgeon: Gemma PayorKerry Bessie Boyte, M.D.  Assistants: None  Anesthesia: Topical with MAC  Indications: Painless, progressive loss of vision with compromise of daily activities.  Surgery: Cataract Extraction with Intraocular lens Implant Right Eye  Discription: The patient had dilating drops and viscous lidocaine placed into the Right eye in the pre-op holding area. After transfer to the operating room, a time out was performed. The patient was then prepped and draped. Beginning with a 75 degree blade a paracentesis port was made at the surgeon's 2 o'clock position. The anterior chamber was then filled with 1% non-preserved lidocaine. This was followed by filling the anterior chamber with Provisc.  A 2.184mm keratome blade was used to make a clear corneal incision at the temporal limbus.  A bent cystatome needle was used to create a continuous tear capsulotomy. Hydrodissection was performed with balanced salt solution on a Fine canula. The lens nucleus was then removed using the phacoemulsification handpiece. Residual cortex was removed with the I&A handpiece. The anterior chamber and capsular bag were refilled with Provisc. A posterior chamber intraocular lens was placed into the capsular bag with it's injector. The implant was positioned with the Kuglan hook. The Provisc was then removed from the anterior chamber and capsular bag with the I&A handpiece. Stromal hydration of the main incision and paracentesis port was performed with BSS on a Fine canula. The wounds were tested for leak which was negative. The patient tolerated the procedure well. There were no operative complications. The patient was then transferred to the recovery room in stable condition.  Complications: None  Specimen: None  EBL: None  Prosthetic device: Hoya iSert 250, power 17.5 D,  SN [id number].

## 2014-05-18 NOTE — H&P (Signed)
I have reviewed the H&P, the patient was re-examined, and I have identified no interval changes in medical condition and plan of care since the history and physical of record  

## 2014-05-18 NOTE — Discharge Instructions (Signed)

## 2014-05-19 ENCOUNTER — Encounter (HOSPITAL_COMMUNITY): Payer: Self-pay | Admitting: Ophthalmology

## 2014-05-27 ENCOUNTER — Encounter (HOSPITAL_COMMUNITY): Payer: Self-pay

## 2014-05-27 ENCOUNTER — Encounter (HOSPITAL_COMMUNITY)
Admission: RE | Admit: 2014-05-27 | Discharge: 2014-05-27 | Disposition: A | Discharge status: Home / Self Care | Payer: Medicare Other | Source: Ambulatory Visit | Attending: Ophthalmology | Admitting: Ophthalmology

## 2014-05-29 MED ORDER — PHENYLEPHRINE HCL 2.5 % OP SOLN
OPHTHALMIC | Status: AC
  Filled 2014-05-29: qty 15

## 2014-05-29 MED ORDER — LIDOCAINE HCL (PF) 1 % IJ SOLN
INTRAMUSCULAR | Status: AC
  Filled 2014-05-29: qty 2

## 2014-05-29 MED ORDER — NEOMYCIN-POLYMYXIN-DEXAMETH 3.5-10000-0.1 OP SUSP
OPHTHALMIC | Status: AC
  Filled 2014-05-29: qty 5

## 2014-05-29 MED ORDER — LIDOCAINE HCL 3.5 % OP GEL
OPHTHALMIC | Status: AC
  Filled 2014-05-29: qty 1

## 2014-05-29 MED ORDER — TETRACAINE HCL 0.5 % OP SOLN
OPHTHALMIC | Status: AC
  Filled 2014-05-29: qty 2

## 2014-05-29 MED ORDER — CYCLOPENTOLATE-PHENYLEPHRINE OP SOLN OPTIME - NO CHARGE
OPHTHALMIC | Status: AC
  Filled 2014-05-29: qty 2

## 2014-06-01 ENCOUNTER — Encounter (HOSPITAL_COMMUNITY): Payer: Self-pay | Admitting: *Deleted

## 2014-06-01 ENCOUNTER — Ambulatory Visit (HOSPITAL_COMMUNITY): Payer: Medicare Other | Admitting: Anesthesiology

## 2014-06-01 ENCOUNTER — Ambulatory Visit (HOSPITAL_COMMUNITY)
Admission: RE | Admit: 2014-06-01 | Discharge: 2014-06-01 | Disposition: A | Discharge status: Home / Self Care | Payer: Medicare Other | Source: Ambulatory Visit | Attending: Ophthalmology | Admitting: Ophthalmology

## 2014-06-01 ENCOUNTER — Encounter (HOSPITAL_COMMUNITY)
Admission: RE | Disposition: A | Discharge status: Home / Self Care | Payer: Self-pay | Source: Ambulatory Visit | Attending: Ophthalmology

## 2014-06-01 DIAGNOSIS — I10 Essential (primary) hypertension: Secondary | ICD-10-CM | POA: Insufficient documentation

## 2014-06-01 DIAGNOSIS — Z7982 Long term (current) use of aspirin: Secondary | ICD-10-CM | POA: Insufficient documentation

## 2014-06-01 DIAGNOSIS — Z87891 Personal history of nicotine dependence: Secondary | ICD-10-CM | POA: Insufficient documentation

## 2014-06-01 DIAGNOSIS — Z79899 Other long term (current) drug therapy: Secondary | ICD-10-CM | POA: Insufficient documentation

## 2014-06-01 DIAGNOSIS — H2512 Age-related nuclear cataract, left eye: Secondary | ICD-10-CM | POA: Diagnosis not present

## 2014-06-01 HISTORY — PX: CATARACT EXTRACTION W/PHACO: SHX586

## 2014-06-01 SURGERY — PHACOEMULSIFICATION, CATARACT, WITH IOL INSERTION
Anesthesia: Monitor Anesthesia Care | Site: Eye | Laterality: Left

## 2014-06-01 MED ORDER — NEOMYCIN-POLYMYXIN-DEXAMETH 3.5-10000-0.1 OP SUSP
OPHTHALMIC | Status: DC | PRN
  Administered 2014-06-01: 2 [drp] via OPHTHALMIC

## 2014-06-01 MED ORDER — PHENYLEPHRINE HCL 2.5 % OP SOLN
1.0000 [drp] | OPHTHALMIC | Status: AC
  Administered 2014-06-01 (×3): 1 [drp] via OPHTHALMIC

## 2014-06-01 MED ORDER — FENTANYL CITRATE 0.05 MG/ML IJ SOLN
INTRAMUSCULAR | Status: AC
  Filled 2014-06-01: qty 2

## 2014-06-01 MED ORDER — LIDOCAINE HCL (PF) 1 % IJ SOLN
INTRAOCULAR | Status: DC | PRN
  Administered 2014-06-01: .5 mL via OPHTHALMIC

## 2014-06-01 MED ORDER — POVIDONE-IODINE 5 % OP SOLN
OPHTHALMIC | Status: DC | PRN
  Administered 2014-06-01: 1 via OPHTHALMIC

## 2014-06-01 MED ORDER — EPINEPHRINE HCL 1 MG/ML IJ SOLN
INTRAMUSCULAR | Status: AC
  Filled 2014-06-01: qty 1

## 2014-06-01 MED ORDER — EPINEPHRINE HCL 1 MG/ML IJ SOLN
INTRAOCULAR | Status: DC | PRN
  Administered 2014-06-01: 500 mL

## 2014-06-01 MED ORDER — LIDOCAINE HCL 3.5 % OP GEL
1.0000 "application " | Freq: Once | OPHTHALMIC | Status: AC
  Administered 2014-06-01: 1 via OPHTHALMIC

## 2014-06-01 MED ORDER — PROVISC 10 MG/ML IO SOLN
INTRAOCULAR | Status: DC | PRN
Start: 2014-06-01 — End: 2014-06-01
  Administered 2014-06-01: 0.85 mL via INTRAOCULAR

## 2014-06-01 MED ORDER — LACTATED RINGERS IV SOLN
INTRAVENOUS | Status: DC
  Administered 2014-06-01: 09:00:00 via INTRAVENOUS

## 2014-06-01 MED ORDER — FENTANYL CITRATE 0.05 MG/ML IJ SOLN
25.0000 ug | INTRAMUSCULAR | Status: AC
  Administered 2014-06-01 (×2): 25 ug via INTRAVENOUS

## 2014-06-01 MED ORDER — TETRACAINE HCL 0.5 % OP SOLN
1.0000 [drp] | OPHTHALMIC | Status: AC
  Administered 2014-06-01 (×3): 1 [drp] via OPHTHALMIC

## 2014-06-01 MED ORDER — MIDAZOLAM HCL 2 MG/2ML IJ SOLN
1.0000 mg | INTRAMUSCULAR | Status: DC | PRN
  Administered 2014-06-01: 2 mg via INTRAVENOUS

## 2014-06-01 MED ORDER — BSS IO SOLN
INTRAOCULAR | Status: DC | PRN
  Administered 2014-06-01: 15 mL

## 2014-06-01 MED ORDER — CYCLOPENTOLATE-PHENYLEPHRINE 0.2-1 % OP SOLN
1.0000 [drp] | OPHTHALMIC | Status: AC
  Administered 2014-06-01 (×3): 1 [drp] via OPHTHALMIC

## 2014-06-01 MED ORDER — MIDAZOLAM HCL 2 MG/2ML IJ SOLN
INTRAMUSCULAR | Status: AC
  Filled 2014-06-01: qty 2

## 2014-06-01 SURGICAL SUPPLY — 34 items
CAPSULAR TENSION RING-AMO (OPHTHALMIC RELATED) IMPLANT
CLOTH BEACON ORANGE TIMEOUT ST (SAFETY) ×3 IMPLANT
EYE SHIELD UNIVERSAL CLEAR (GAUZE/BANDAGES/DRESSINGS) ×3 IMPLANT
GLOVE BIO SURGEON STRL SZ 6.5 (GLOVE) IMPLANT
GLOVE BIO SURGEONS STRL SZ 6.5 (GLOVE)
GLOVE BIOGEL PI IND STRL 6.5 (GLOVE) IMPLANT
GLOVE BIOGEL PI IND STRL 7.0 (GLOVE) ×1 IMPLANT
GLOVE BIOGEL PI IND STRL 7.5 (GLOVE) IMPLANT
GLOVE BIOGEL PI INDICATOR 6.5 (GLOVE)
GLOVE BIOGEL PI INDICATOR 7.0 (GLOVE) ×2
GLOVE BIOGEL PI INDICATOR 7.5 (GLOVE)
GLOVE ECLIPSE 6.5 STRL STRAW (GLOVE) IMPLANT
GLOVE ECLIPSE 7.0 STRL STRAW (GLOVE) IMPLANT
GLOVE ECLIPSE 7.5 STRL STRAW (GLOVE) IMPLANT
GLOVE EXAM NITRILE LRG STRL (GLOVE) IMPLANT
GLOVE EXAM NITRILE MD LF STRL (GLOVE) IMPLANT
GLOVE SKINSENSE NS SZ6.5 (GLOVE)
GLOVE SKINSENSE NS SZ7.0 (GLOVE)
GLOVE SKINSENSE STRL SZ6.5 (GLOVE) IMPLANT
GLOVE SKINSENSE STRL SZ7.0 (GLOVE) IMPLANT
KIT VITRECTOMY (OPHTHALMIC RELATED) IMPLANT
PAD ARMBOARD 7.5X6 YLW CONV (MISCELLANEOUS) ×3 IMPLANT
PROC W NO LENS (INTRAOCULAR LENS)
PROC W SPEC LENS (INTRAOCULAR LENS)
PROCESS W NO LENS (INTRAOCULAR LENS) IMPLANT
PROCESS W SPEC LENS (INTRAOCULAR LENS) IMPLANT
RETRACTOR IRIS SIGHTPATH (OPHTHALMIC RELATED) IMPLANT
RING MALYGIN (MISCELLANEOUS) IMPLANT
SIGHTPATH CAT PROC W REG LENS (Ophthalmic Related) ×3 IMPLANT
SYRINGE LUER LOK 1CC (MISCELLANEOUS) ×3 IMPLANT
TAPE SURG TRANSPORE 1 IN (GAUZE/BANDAGES/DRESSINGS) ×1 IMPLANT
TAPE SURGICAL TRANSPORE 1 IN (GAUZE/BANDAGES/DRESSINGS) ×2
VISCOELASTIC ADDITIONAL (OPHTHALMIC RELATED) IMPLANT
WATER STERILE IRR 250ML POUR (IV SOLUTION) ×3 IMPLANT

## 2014-06-01 NOTE — Anesthesia Preprocedure Evaluation (Signed)
Anesthesia Evaluation  Patient identified by MRN, date of birth, ID band Patient awake    Reviewed: Allergy & Precautions, H&P , NPO status , Patient's Chart, lab work & pertinent test results  Airway Mallampati: II  TM Distance: >3 FB     Dental  (+) Edentulous Upper, Edentulous Lower   Pulmonary former smoker,  breath sounds clear to auscultation        Cardiovascular hypertension, Pt. on medications + CAD and + Peripheral Vascular Disease Rhythm:Regular Rate:Normal     Neuro/Psych    GI/Hepatic negative GI ROS,   Endo/Other    Renal/GU      Musculoskeletal   Abdominal   Peds  Hematology   Anesthesia Other Findings   Reproductive/Obstetrics                             Anesthesia Physical Anesthesia Plan  ASA: III  Anesthesia Plan: MAC   Post-op Pain Management:    Induction: Intravenous  Airway Management Planned: Nasal Cannula  Additional Equipment:   Intra-op Plan:   Post-operative Plan:   Informed Consent: I have reviewed the patients History and Physical, chart, labs and discussed the procedure including the risks, benefits and alternatives for the proposed anesthesia with the patient or authorized representative who has indicated his/her understanding and acceptance.     Plan Discussed with:   Anesthesia Plan Comments:         Anesthesia Quick Evaluation  

## 2014-06-01 NOTE — H&P (Signed)
I have reviewed the H&P, the patient was re-examined, and I have identified no interval changes in medical condition and plan of care since the history and physical of record  

## 2014-06-01 NOTE — Anesthesia Postprocedure Evaluation (Signed)
  Anesthesia Post-op Note  Patient: Ricardo Castillo  Procedure(s) Performed: Procedure(s) with comments: CATARACT EXTRACTION PHACO AND INTRAOCULAR LENS PLACEMENT LEFT EYE (Left) - CDE 19.42  Patient Location: Short Stay  Anesthesia Type:MAC  Level of Consciousness: awake, alert , oriented and patient cooperative  Airway and Oxygen Therapy: Patient Spontanous Breathing  Post-op Pain: none  Post-op Assessment: Post-op Vital signs reviewed, Patient's Cardiovascular Status Stable, Respiratory Function Stable, Patent Airway, No signs of Nausea or vomiting and Pain level controlled  Post-op Vital Signs: Reviewed and stable  Last Vitals:  Filed Vitals:   06/01/14 1000  BP: 103/56  Temp:   Resp: 14    Complications: No apparent anesthesia complications

## 2014-06-01 NOTE — Transfer of Care (Signed)
Immediate Anesthesia Transfer of Care Note  Patient: Ricardo Castillo  Procedure(s) Performed: Procedure(s) with comments: CATARACT EXTRACTION PHACO AND INTRAOCULAR LENS PLACEMENT LEFT EYE (Left) - CDE 19.42  Patient Location: Short Stay  Anesthesia Type:MAC  Level of Consciousness: awake, alert , oriented and patient cooperative  Airway & Oxygen Therapy: Patient Spontanous Breathing  Post-op Assessment: Report given to PACU RN and Post -op Vital signs reviewed and stable  Post vital signs: Reviewed and stable  Complications: No apparent anesthesia complications

## 2014-06-01 NOTE — Op Note (Signed)
Date of Admission: 06/01/2014  Date of Surgery: 06/01/2014   Pre-Op Dx: Cataract Left Eye  Post-Op Dx: Senile Nuclear Cataract Left  Eye,  Dx Code H25.12  Surgeon: Gemma PayorKerry Carlton Buskey, M.D.  Assistants: None  Anesthesia: Topical with MAC  Indications: Painless, progressive loss of vision with compromise of daily activities.  Surgery: Cataract Extraction with Intraocular lens Implant Left Eye  Discription: The patient had dilating drops and viscous lidocaine placed into the Left eye in the pre-op holding area. After transfer to the operating room, a time out was performed. The patient was then prepped and draped. Beginning with a 75 degree blade a paracentesis port was made at the surgeon's 2 o'clock position. The anterior chamber was then filled with 1% non-preserved lidocaine. This was followed by filling the anterior chamber with Provisc.  A 2.764mm keratome blade was used to make a clear corneal incision at the temporal limbus.  A bent cystatome needle was used to create a continuous tear capsulotomy. Hydrodissection was performed with balanced salt solution on a Fine canula. The lens nucleus was then removed using the phacoemulsification handpiece. Residual cortex was removed with the I&A handpiece. The anterior chamber and capsular bag were refilled with Provisc. A posterior chamber intraocular lens was placed into the capsular bag with it's injector. The implant was positioned with the Kuglan hook. The Provisc was then removed from the anterior chamber and capsular bag with the I&A handpiece. Stromal hydration of the main incision and paracentesis port was performed with BSS on a Fine canula. The wounds were tested for leak which was negative. The patient tolerated the procedure well. There were no operative complications. The patient was then transferred to the recovery room in stable condition.  Complications: None  Specimen: None  EBL: None  Prosthetic device: Hoya iSert 250, power 17.5 D, SN  [id number].

## 2014-06-01 NOTE — Discharge Instructions (Signed)

## 2014-06-02 ENCOUNTER — Encounter (HOSPITAL_COMMUNITY): Payer: Self-pay | Admitting: Ophthalmology

## 2014-07-23 DIAGNOSIS — Z961 Presence of intraocular lens: Secondary | ICD-10-CM | POA: Diagnosis not present

## 2014-11-30 DIAGNOSIS — E782 Mixed hyperlipidemia: Secondary | ICD-10-CM | POA: Diagnosis not present

## 2014-12-02 DIAGNOSIS — E782 Mixed hyperlipidemia: Secondary | ICD-10-CM | POA: Diagnosis not present

## 2014-12-02 DIAGNOSIS — I10 Essential (primary) hypertension: Secondary | ICD-10-CM | POA: Diagnosis not present

## 2014-12-02 DIAGNOSIS — I2510 Atherosclerotic heart disease of native coronary artery without angina pectoris: Secondary | ICD-10-CM | POA: Diagnosis not present

## 2014-12-10 ENCOUNTER — Other Ambulatory Visit (HOSPITAL_COMMUNITY): Payer: Self-pay | Admitting: Internal Medicine

## 2014-12-10 DIAGNOSIS — I714 Abdominal aortic aneurysm, without rupture, unspecified: Secondary | ICD-10-CM

## 2014-12-16 ENCOUNTER — Ambulatory Visit (HOSPITAL_COMMUNITY): Payer: Medicare Other

## 2015-01-15 ENCOUNTER — Ambulatory Visit (INDEPENDENT_AMBULATORY_CARE_PROVIDER_SITE_OTHER): Payer: Medicare Other | Admitting: Cardiology

## 2015-01-15 ENCOUNTER — Encounter: Payer: Self-pay | Admitting: Cardiology

## 2015-01-15 VITALS — BP 108/62 | HR 79 | Ht 69.0 in | Wt 162.8 lb

## 2015-01-15 DIAGNOSIS — I2510 Atherosclerotic heart disease of native coronary artery without angina pectoris: Secondary | ICD-10-CM

## 2015-01-15 DIAGNOSIS — E782 Mixed hyperlipidemia: Secondary | ICD-10-CM | POA: Diagnosis not present

## 2015-01-15 DIAGNOSIS — I714 Abdominal aortic aneurysm, without rupture, unspecified: Secondary | ICD-10-CM

## 2015-01-15 DIAGNOSIS — I10 Essential (primary) hypertension: Secondary | ICD-10-CM | POA: Diagnosis not present

## 2015-01-15 NOTE — Patient Instructions (Signed)
Your physician wants you to follow-up in: 1 year with Dr Randa SpikeMcdowell You will receive a reminder letter in the mail two months in advance. If you don't receive a letter, please call our office to schedule the follow-up appointment.   Your physician recommends that you continue on your current medications as directed. Please refer to the Current Medication list given to you today.    Your physician has requested that you have an abdominal aorta duplex. During this test, an ultrasound is used to evaluate the aorta. Allow 30 minutes for this exam. Do not eat after midnight the day before and avoid carbonated beverages    Thank you for choosing [facility] !

## 2015-01-15 NOTE — Progress Notes (Signed)
Cardiology Office Note  Date: 01/15/2015   ID: [name], DOB 09/06/1935, MRN [id number]  PCP: Catalina Pizza, MD  Primary Cardiologist: Nona Dell, MD   Chief Complaint  Patient presents with  . Coronary Artery Disease  . AAA    History of Present Illness: Ricardo Castillo is a 79 y.o. male last seen in May 2015. He presents for a routine follow-up visit. Continues to do well, functional with his ADLs including working in his garden and other yard work. He reports no angina symptoms or nitroglycerin use. Follow-up ECGs reviewed below.  He has undergone ischemic testing within the last 2 years, as outlined below. CABG was in 2002. He remains on aspirin and statin therapy.  Abdominal ultrasound from last year showed a 4 cm infrarenal AAA. He denies any abdominal pain. He is due for follow-up imaging.  He continues to follow with Dr. Margo Aye for lipid management. Reports no side effects with Zocor.   Past Medical History  Diagnosis Date  . Coronary atherosclerosis of native coronary artery     Multivessel status post CABG  . Essential hypertension, benign   . Mixed hyperlipidemia   . Abdominal aortic aneurysm     Past Surgical History  Procedure Laterality Date  . Coronary artery bypass graft  2002    LIMA to LAD, SVG OM and PLB  . Hernia repair  1969  . Tonsillectomy  Age 71  . Cataract extraction w/phaco Right 05/18/2014    Procedure: CATARACT EXTRACTION PHACO AND INTRAOCULAR LENS PLACEMENT (IOC);  Surgeon: Gemma Payor, MD;  Location: [facility];  Service: Ophthalmology;  Laterality: Right;  CDE 19.59  . Cataract extraction w/phaco Left 06/01/2014    Procedure: CATARACT EXTRACTION PHACO AND INTRAOCULAR LENS PLACEMENT LEFT EYE;  Surgeon: Gemma Payor, MD;  Location: [facility];  Service: Ophthalmology;  Laterality: Left;  CDE 19.42    Current Outpatient Prescriptions  Medication Sig Dispense Refill  . aspirin 81 MG tablet Take 81 mg by mouth daily.    . finasteride (PROSCAR) 5 MG  tablet Take 5 mg by mouth daily.    . fish oil-omega-3 fatty acids 1000 MG capsule Take 1 g by mouth daily.    . Multiple Vitamin (MULTIVITAMIN WITH MINERALS) TABS Take 1 tablet by mouth daily.    . Niacin CR 1000 MG TBCR Take 1,000 mg by mouth daily.    . simvastatin (ZOCOR) 40 MG tablet Take 40 mg by mouth every evening.    . terazosin (HYTRIN) 5 MG capsule Take 5 mg by mouth at bedtime.     No current facility-administered medications for this visit.    Allergies:  Review of patient's allergies indicates no known allergies.   Social History: The patient  reports that he quit smoking about 26 years ago. His smoking use included Cigarettes. He quit after 25 years of use. He does not have any smokeless tobacco history on file. He reports that he does not drink alcohol or use illicit drugs.   ROS:  Please see the history of present illness. Otherwise, complete review of systems is positive for mildly decreased hearing. Occasional indigestion. All other systems are reviewed and negative.   Physical Exam: VS:  BP 108/62 mmHg  Pulse 79  Ht  (1.753 m)  Wt 162 lb 12.8 oz (73.846 kg)  BMI 24.03 kg/m2  SpO2 97%, BMI Body mass index is 24.03 kg/(m^2).  Wt Readings from Last 3 Encounters:  01/15/15 162 lb 12.8 oz (73.846  kg)  06/01/14 165 lb (74.844 kg)  05/18/14 165 lb (74.844 kg)     Patient appears comfortable at rest. HEENT: Conjunctiva and lids normal, oropharynx clear. Neck: Supple, no elevated JVP or carotid bruits, no thyromegaly. Lungs: Clear to auscultation, nonlabored breathing at rest. Cardiac: Regular rate and rhythm, no S3, 2/6 apical systolic murmur, no pericardial rub. Abdomen: Soft, nontender, bowel sounds present. No bruit. Extremities: No pitting edema, distal pulses 2+. Skin: Warm and dry. Musculoskeletal: No kyphosis. Neuropsychiatric: Alert and oriented x3, affect grossly appropriate.   ECG: ECG is ordered today and reviewed showing sinus bradycardia with  nonspecific ST changes.  Recent Labwork: 05/13/2014: BUN 14; Creatinine, Ser 1.10; Hemoglobin 14.1; Potassium 4.5; Sodium 141   Other Studies Reviewed Today:  Lexiscan Cardiolite June 2014 demonstrated no evidence of ischemia with LVEF 53%.   Abdominal ultrasound 01/12/2014: IMPRESSION: 1. 4.0 cm infrarenal abdominal aortic aneurysm.  2. 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  ASSESSMENT AND PLAN:  1. Multivessel CAD status post CABG in 2002. No significant angina symptoms with stable functional capacity. Ischemic workup from within the last 2 years was low risk. Continue medical therapy and observation.  2. Hyperlipidemia, medications include Zocor, niacin CR, and omega 3 supplements. Lipids have been followed by Dr. Margo AyeHall. Would suggest stopping Niaspan to reduce side effect profile.  3. Abdominal aortic aneurysm, 4 cm as of July 2015. Follow-up abdominal ultrasound will be obtained.  4. Essential hypertension, blood pressure normal today.  Current medicines were reviewed at length with the patient today.   Orders Placed This Encounter  Procedures  . US Aorta  . EKG 12-Lead    Disposition: FU with me in 1 year.   Signed, Jonelle SidleSamuel G. Arjan Strohm, MD, [facility] 01/15/2015 9:51 AM     [facility] [address], [place], [place] [address] Phone: [phone]; Fax: [phone]

## 2015-01-21 ENCOUNTER — Ambulatory Visit (HOSPITAL_COMMUNITY)
Admission: RE | Admit: 2015-01-21 | Discharge: 2015-01-21 | Disposition: A | Discharge status: Home / Self Care | Payer: Medicare Other | Source: Ambulatory Visit | Attending: Cardiology | Admitting: Cardiology

## 2015-01-21 ENCOUNTER — Telehealth: Payer: Self-pay

## 2015-01-21 DIAGNOSIS — I714 Abdominal aortic aneurysm, without rupture, unspecified: Secondary | ICD-10-CM

## 2015-01-21 NOTE — Telephone Encounter (Signed)
Spoke to PT and informed him of test results.

## 2015-03-11 DIAGNOSIS — B029 Zoster without complications: Secondary | ICD-10-CM | POA: Diagnosis not present

## 2015-03-11 DIAGNOSIS — M792 Neuralgia and neuritis, unspecified: Secondary | ICD-10-CM | POA: Diagnosis not present

## 2015-03-29 ENCOUNTER — Encounter: Payer: Self-pay | Admitting: Internal Medicine

## 2015-05-04 DIAGNOSIS — Z23 Encounter for immunization: Secondary | ICD-10-CM | POA: Diagnosis not present

## 2015-05-28 DIAGNOSIS — E782 Mixed hyperlipidemia: Secondary | ICD-10-CM | POA: Diagnosis not present

## 2015-05-28 DIAGNOSIS — Z125 Encounter for screening for malignant neoplasm of prostate: Secondary | ICD-10-CM | POA: Diagnosis not present

## 2015-05-31 DIAGNOSIS — I10 Essential (primary) hypertension: Secondary | ICD-10-CM | POA: Diagnosis not present

## 2015-05-31 DIAGNOSIS — Z23 Encounter for immunization: Secondary | ICD-10-CM | POA: Diagnosis not present

## 2015-05-31 DIAGNOSIS — E782 Mixed hyperlipidemia: Secondary | ICD-10-CM | POA: Diagnosis not present

## 2015-05-31 DIAGNOSIS — I714 Abdominal aortic aneurysm, without rupture: Secondary | ICD-10-CM | POA: Diagnosis not present

## 2015-05-31 DIAGNOSIS — I2510 Atherosclerotic heart disease of native coronary artery without angina pectoris: Secondary | ICD-10-CM | POA: Diagnosis not present

## 2015-11-15 DIAGNOSIS — E782 Mixed hyperlipidemia: Secondary | ICD-10-CM | POA: Diagnosis not present

## 2015-11-15 DIAGNOSIS — I10 Essential (primary) hypertension: Secondary | ICD-10-CM | POA: Diagnosis not present

## 2015-11-18 DIAGNOSIS — E782 Mixed hyperlipidemia: Secondary | ICD-10-CM | POA: Diagnosis not present

## 2015-11-18 DIAGNOSIS — I10 Essential (primary) hypertension: Secondary | ICD-10-CM | POA: Diagnosis not present

## 2015-11-18 DIAGNOSIS — I714 Abdominal aortic aneurysm, without rupture: Secondary | ICD-10-CM | POA: Diagnosis not present

## 2015-11-18 DIAGNOSIS — I2510 Atherosclerotic heart disease of native coronary artery without angina pectoris: Secondary | ICD-10-CM | POA: Diagnosis not present

## 2016-02-04 NOTE — Progress Notes (Signed)
Cardiology Office Note  Date: 02/07/2016   ID: Kedrick Jesmer Haigh, DOB 08/30/1935, MRN [id number]  PCP: Dwana Melena, MD  Primary Cardiologist: Nona Dell, MD   Chief Complaint  Patient presents with  . Coronary Artery Disease  . AAA    History of Present Illness: Jamus Recla Scalf is a 80 y.o. male last seen in July 2016. He presents for a follow-up visit. Reports no angina symptoms with stable NYHA class II dyspnea. Takes care of his ADLs including yard work, no change in stamina.  I reviewed his medications which are outlined below and stable from a cardiac perspective. He indicates that his lipids have been well controlled, had these checked with Dr. Margo Aye a few months ago. We are requesting the results for review. I reviewed his ECG today which shows sinus bradycardia with diffuse nonspecific T-wave abnormalities.  Abdominal ultrasound from last year showed abdominal aortic aneurysm measuring 4.1 cm. He has had no abdominal pain.  Ischemic testing from 2014 was low risk  Past Medical History:  Diagnosis Date  . Abdominal aortic aneurysm (HCC)    Duplex doppler 04/08/12 - Abnormal, repeat in 12 months  . CAD (coronary artery disease)   . COPD (chronic obstructive pulmonary disease) (HCC)   . Coronary atherosclerosis of native coronary artery    Multivessel status post CABG  . Dyspnea on exertion    Nuclear stress test was negative for ischemia & showed a preserved EF of 65%  . Essential hypertension, benign   . Hyperlipidemia   . Mixed hyperlipidemia   . Murmur, cardiac    ECHO 03/08/11 - EF greater than 55% with stage I diastolic dysfunction, mildly elevated left-sided filling pressure. The LA was enlarges mildly, no significant increase in LV wall thickness  . Tobacco use     Past Surgical History:  Procedure Laterality Date  . CATARACT EXTRACTION W/PHACO Right 05/18/2014   Procedure: CATARACT EXTRACTION PHACO AND INTRAOCULAR LENS PLACEMENT (IOC);  Surgeon: Gemma Payor, MD;   Location: [facility];  Service: Ophthalmology;  Laterality: Right;  CDE 19.59  . CATARACT EXTRACTION W/PHACO Left 06/01/2014   Procedure: CATARACT EXTRACTION PHACO AND INTRAOCULAR LENS PLACEMENT LEFT EYE;  Surgeon: Gemma Payor, MD;  Location: [facility];  Service: Ophthalmology;  Laterality: Left;  CDE 19.42  . CORONARY ARTERY BYPASS GRAFT  11/09/00   LIMA to LAD, SVG OM and PLB  . HERNIA REPAIR  1969  . TONSILLECTOMY  Age 89    Current Outpatient Prescriptions  Medication Sig Dispense Refill  . aspirin 81 MG tablet Take 81 mg by mouth daily.    . finasteride (PROSCAR) 5 MG tablet Take 5 mg by mouth daily.    . Niacin CR 1000 MG TBCR Take 1,000 mg by mouth daily.    . simvastatin (ZOCOR) 40 MG tablet Take 40 mg by mouth every evening.    . terazosin (HYTRIN) 5 MG capsule Take 5 mg by mouth at bedtime.     No current facility-administered medications for this visit.    Allergies:  Other   Social History: The patient  reports that he quit smoking about 27 years ago. His smoking use included Cigarettes. He quit after 25.00 years of use. He does not have any smokeless tobacco history on file. He reports that he does not drink alcohol or use drugs.   ROS:  Please see the history of present illness. Otherwise, complete review of systems is positive for erectile dysfunction.  All other systems are reviewed  and negative.   Physical Exam: VS:  BP 134/60   Pulse 60   Ht  (1.753 m)   Wt 169 lb (76.7 kg)   BMI 24.96 kg/m , BMI Body mass index is 24.96 kg/m.  Wt Readings from Last 3 Encounters:  02/07/16 169 lb (76.7 kg)  01/15/15 162 lb 12.8 oz (73.8 kg)  06/01/14 165 lb (74.8 kg)    Elderly male, appears comfortable at rest. HEENT: Conjunctiva and lids normal, oropharynx clear. Neck: Supple, no elevated JVP or carotid bruits, no thyromegaly. Lungs: Clear to auscultation, nonlabored breathing at rest. Cardiac: Regular rate and rhythm, no S3, 2/6 apical systolic murmur, no pericardial  rub. Abdomen: Soft, nontender, bowel sounds present. No bruit. Extremities: No pitting edema, distal pulses 2+. Skin: Warm and dry. Musculoskeletal: No kyphosis. Neuropsychiatric: Alert and oriented x3, affect grossly appropriate.  ECG: I personally reviewed the tracing from 01/15/2015 which showed sinus bradycardia with nonspecific ST-T changes.  Other Studies Reviewed Today:  Lexiscan Cardiolite June 2014: No evidence of ischemia with LVEF 53%.   Abdominal ultrasound 01/21/2015: FINDINGS: Abdominal Aorta  Mid abdominal aortic aneurysm again identified. Measures maximally 3.5 x 4.1 cm, including on image 18. Similar to 4.0 cm on the prior exam.  The right common iliac artery is ectatic, maximally 1.5 cm. The left common iliac artery is minimally prominent, 1.3 cm.  IMPRESSION: 1. Similar mid abdominal aortic aneurysm, maximally 4.1 cm. 2. Right worse than left common iliac artery dilatation.  Assessment and Plan:  1. Multivessel CAD status post CABG in 2002. Ischemic testing from 2014 was low risk. He is not reporting any angina symptoms on medical therapy, and we will continue observation.  2. Hyperlipidemia, on Zocor and niacin CR. Requesting lab work from Dr. Margo Aye for review.  3. Asymptomatic abdominal aortic aneurysm, last measurement 4.1 cm in July 2016. Follow-up ultrasound will be obtained.  4. Essential hypertension, blood pressure is adequately controlled today.  Current medicines were reviewed with the patient today.   Orders Placed This Encounter  Procedures  . EKG 12-Lead    Disposition: Follow-up with me in one year.  Signed, Jonelle Sidle, MD, [facility] 02/07/2016 8:23 AM    [facility] at [facility] [address] [place], [place] [address] Phone: [phone]; Fax: [phone]

## 2016-02-07 ENCOUNTER — Other Ambulatory Visit: Payer: Self-pay

## 2016-02-07 ENCOUNTER — Encounter: Payer: Self-pay | Admitting: Cardiology

## 2016-02-07 ENCOUNTER — Encounter: Payer: Self-pay | Admitting: *Deleted

## 2016-02-07 ENCOUNTER — Ambulatory Visit (INDEPENDENT_AMBULATORY_CARE_PROVIDER_SITE_OTHER): Payer: Medicare Other | Admitting: Cardiology

## 2016-02-07 VITALS — BP 134/60 | HR 60 | Ht 69.0 in | Wt 169.0 lb

## 2016-02-07 DIAGNOSIS — E782 Mixed hyperlipidemia: Secondary | ICD-10-CM | POA: Diagnosis not present

## 2016-02-07 DIAGNOSIS — I10 Essential (primary) hypertension: Secondary | ICD-10-CM | POA: Diagnosis not present

## 2016-02-07 DIAGNOSIS — I2510 Atherosclerotic heart disease of native coronary artery without angina pectoris: Secondary | ICD-10-CM

## 2016-02-07 DIAGNOSIS — I714 Abdominal aortic aneurysm, without rupture, unspecified: Secondary | ICD-10-CM

## 2016-02-07 NOTE — Addendum Note (Signed)
Addended by: Marlyn Corporal A on: 02/07/2016 08:30 AM   Modules accepted: Orders

## 2016-02-07 NOTE — Patient Instructions (Signed)
Your physician wants you to follow-up in: 1 year You will receive a reminder letter in the mail two months in advance. If you don't receive a letter, please call our office to schedule the follow-up appointment.   Your physician has requested that you have an abdominal aorta duplex. During this test, an ultrasound is used to evaluate the aorta. Allow 30 minutes for this exam. Do not eat after midnight the day before and avoid carbonated beverages   Your physician recommends that you continue on your current medications as directed. Please refer to the Current Medication list given to you today.    Thank you for choosing [facility] !

## 2016-02-14 ENCOUNTER — Ambulatory Visit (HOSPITAL_COMMUNITY)
Admission: RE | Admit: 2016-02-14 | Discharge: 2016-02-14 | Disposition: A | Discharge status: Home / Self Care | Payer: Medicare Other | Source: Ambulatory Visit | Attending: Cardiology | Admitting: Cardiology

## 2016-02-14 DIAGNOSIS — I714 Abdominal aortic aneurysm, without rupture, unspecified: Secondary | ICD-10-CM

## 2016-06-14 IMAGING — US US AORTA
1 series · 14 of 25 positions shown · non-contrast
Comparison: 01/12/2014

CLINICAL DATA: Re-evaluate infrarenal abdominal aortic aneurysm.

EXAM:
ULTRASOUND OF ABDOMINAL AORTA
TECHNIQUE: Ultrasound examination of the abdominal aorta was performed to
evaluate for abdominal aortic aneurysm.

[Series 1: us aorta · 0.30mm/px · 14 of 33 slices shown]
[im 1/33]
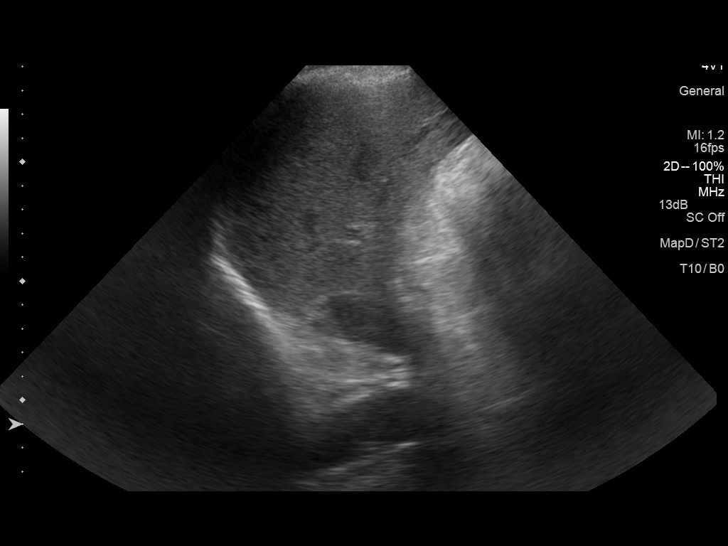
[im 3/33]
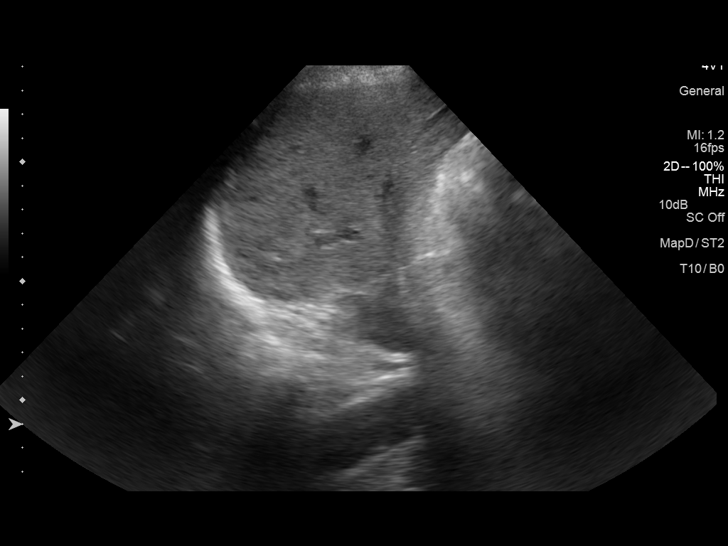
[im 6/33]
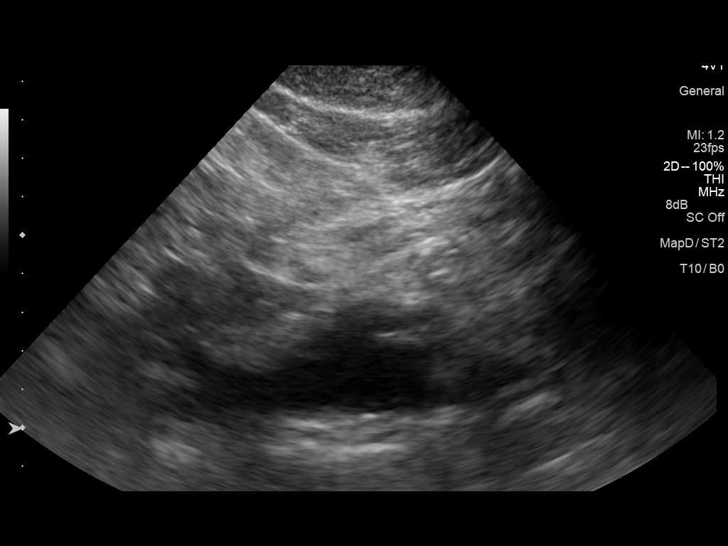
[im 9/33]
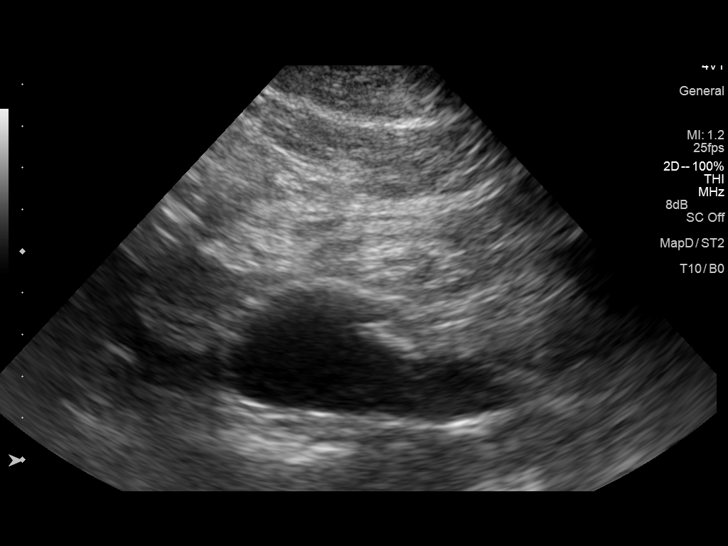
[im 11/33]
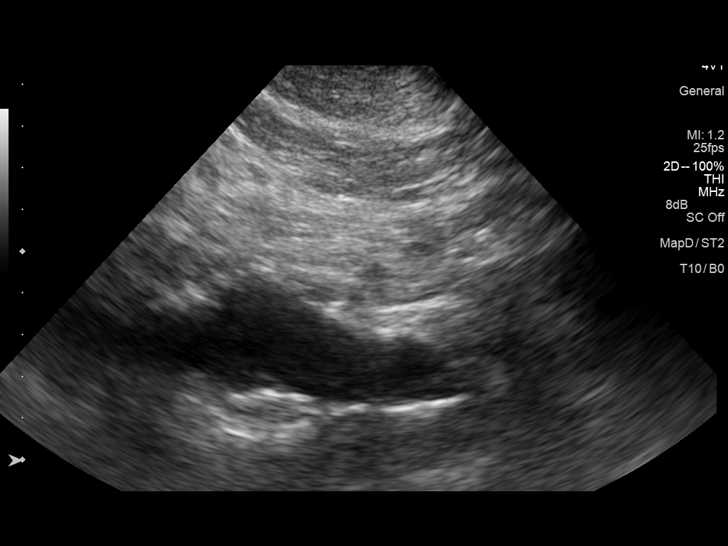
[im 13/33]
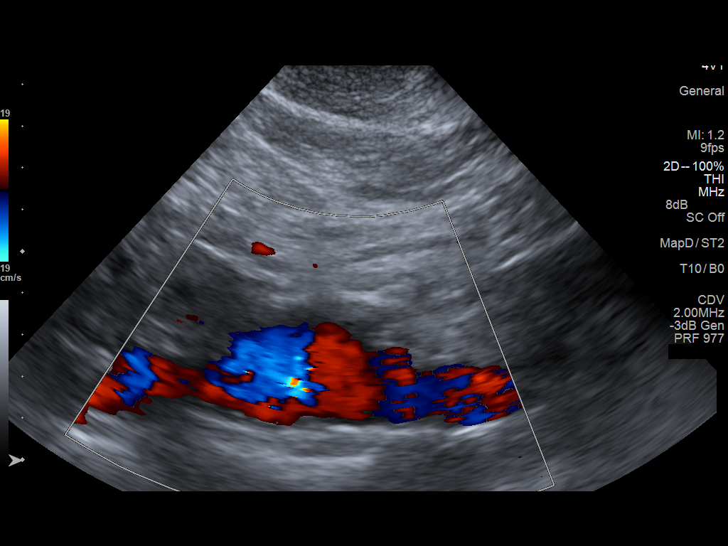
[im 15/33]
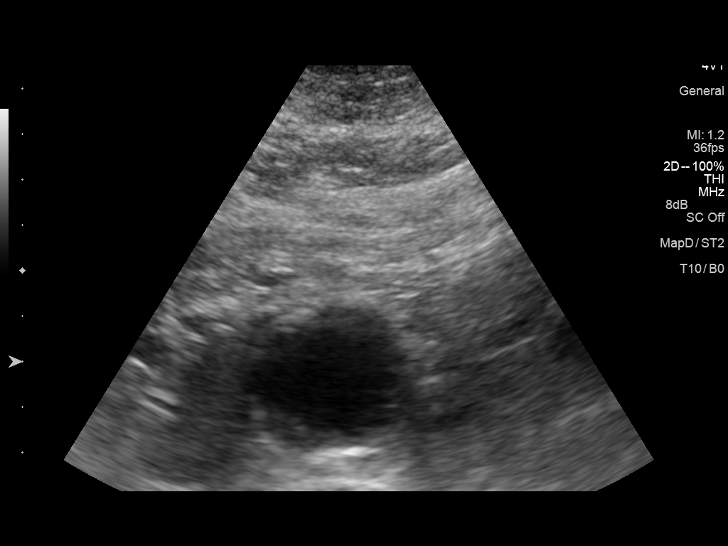
[im 18/33]
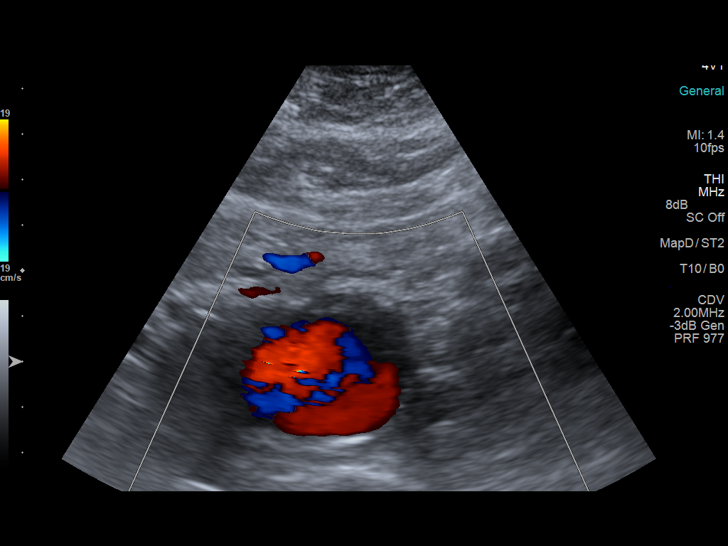
[im 21/33]
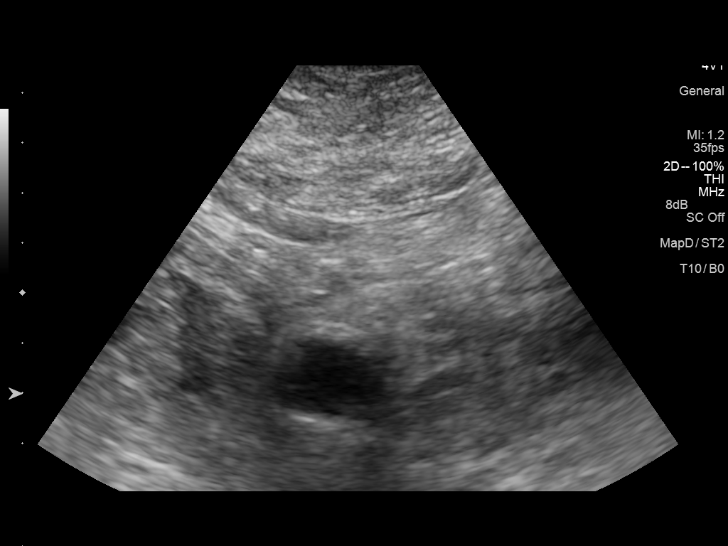
[im 22/33]
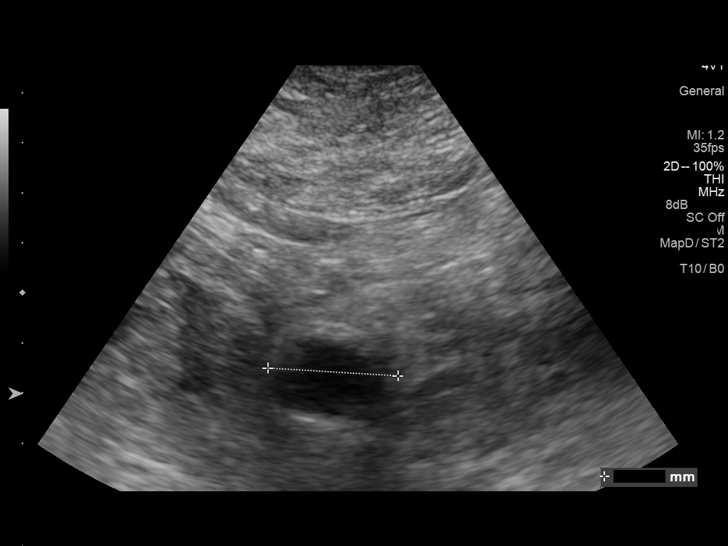
[im 25/33]
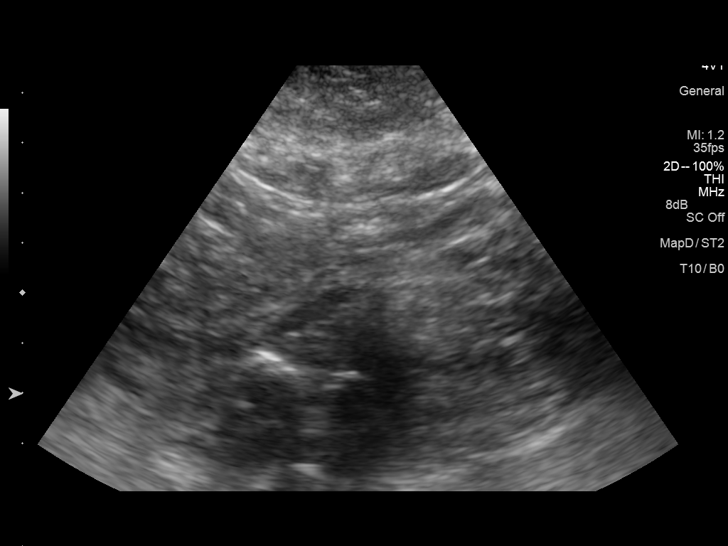
[im 27/33]
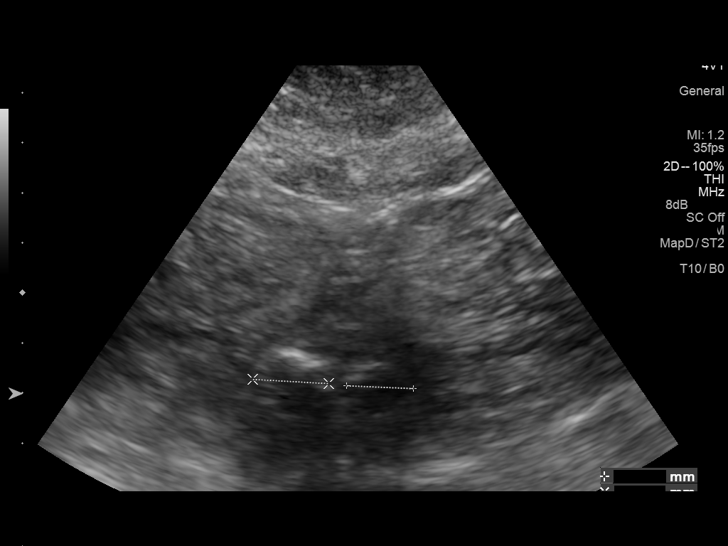
[im 30/33]
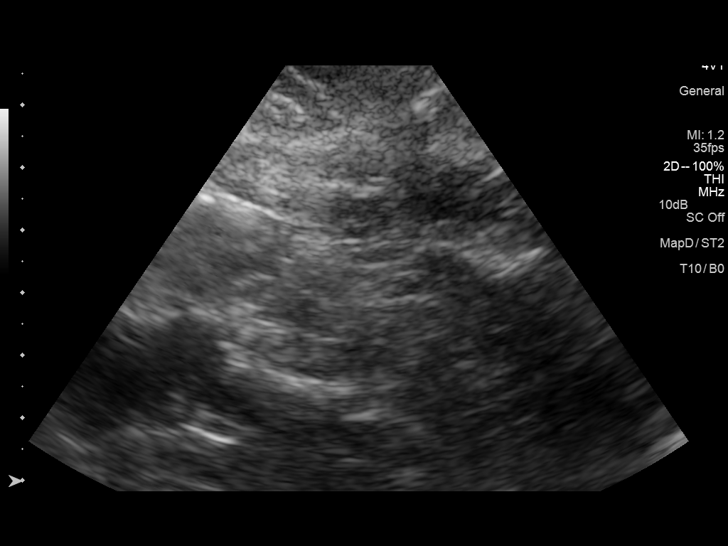
[im 33/33]
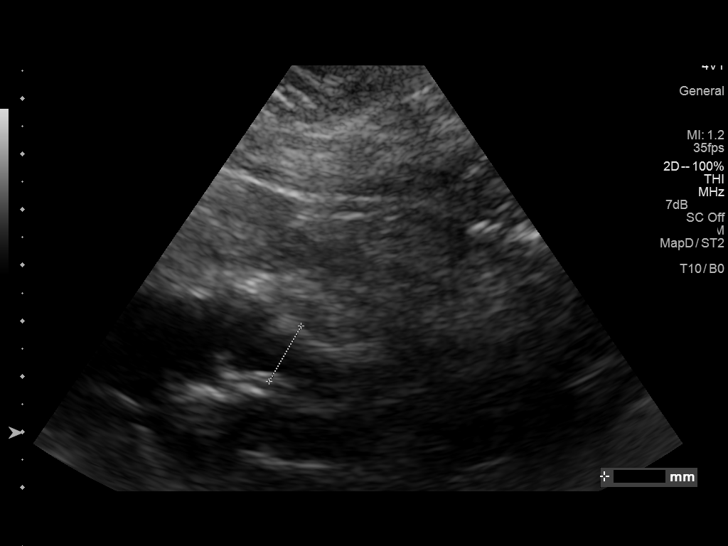

[14 of 25 positions shown; findings below may reference images not displayed]

FINDINGS: Abdominal Aorta

Mid abdominal aortic aneurysm again identified. Measures maximally
3.5 x 4.1 cm, including on image 18. Similar to 4.0 cm on the prior
exam.

The right common iliac artery is ectatic, maximally 1.5 cm. The left
common iliac artery is minimally prominent, 1.3 cm.
IMPRESSION: 1. Similar mid abdominal aortic aneurysm, maximally 4.1 cm.
2. Right worse than left common iliac artery dilatation.

## 2016-12-27 DIAGNOSIS — I10 Essential (primary) hypertension: Secondary | ICD-10-CM | POA: Diagnosis not present

## 2016-12-27 DIAGNOSIS — E119 Type 2 diabetes mellitus without complications: Secondary | ICD-10-CM | POA: Diagnosis not present

## 2016-12-29 DIAGNOSIS — E782 Mixed hyperlipidemia: Secondary | ICD-10-CM | POA: Diagnosis not present

## 2016-12-29 DIAGNOSIS — I2510 Atherosclerotic heart disease of native coronary artery without angina pectoris: Secondary | ICD-10-CM | POA: Diagnosis not present

## 2016-12-29 DIAGNOSIS — I714 Abdominal aortic aneurysm, without rupture: Secondary | ICD-10-CM | POA: Diagnosis not present

## 2016-12-29 DIAGNOSIS — I10 Essential (primary) hypertension: Secondary | ICD-10-CM | POA: Diagnosis not present

## 2016-12-29 DIAGNOSIS — D696 Thrombocytopenia, unspecified: Secondary | ICD-10-CM | POA: Diagnosis not present

## 2017-04-06 ENCOUNTER — Encounter: Payer: Self-pay | Admitting: Cardiology

## 2017-04-06 NOTE — Progress Notes (Signed)
Cardiology Office Note  Date: 04/09/2017   ID: Ricardo Castillo, DOB 12-30-1935, MRN [id number]  PCP: Benita Stabile, MD  Primary Cardiologist: Nona Dell, MD   Chief Complaint  Patient presents with  . Coronary Artery Disease    History of Present Illness: Ricardo Castillo is a 81 y.o. male last seen in July 2017. He presents for a routine follow-up visit. He reports progressive dyspnea on exertion with his usual activities. This is been a slowly progressive problem over the last few years. He indicates NYHA class 2-3 symptoms. No palpitations, dizziness, syncope. No definite angina symptoms or glycerin use.  We reviewed his current medications. He reports compliance. He follows up regularly with Dr. Margo Aye for lab work.  I personally reviewed his ECG today which shows sinus rhythm with diffuse nonspecific ST-T wave abnormalities, likely repolarization changes. His last ischemic evaluation was in 2014. We discussed obtaining a follow-up Myoview study to reassess ischemic burden in light of his shortness of breath.  Abdominal ultrasound from last year showed stable abdominal aortic aneurysm measuring 4.0 cm.  Past Medical History:  Diagnosis Date  . Abdominal aortic aneurysm (HCC)   . COPD (chronic obstructive pulmonary disease) (HCC)   . Coronary atherosclerosis of native coronary artery    Multivessel status post CABG  . Essential hypertension, benign   . Hyperlipidemia   . Mixed hyperlipidemia   . Tobacco use     Past Surgical History:  Procedure Laterality Date  . CATARACT EXTRACTION W/PHACO Right 05/18/2014   Procedure: CATARACT EXTRACTION PHACO AND INTRAOCULAR LENS PLACEMENT (IOC);  Surgeon: Gemma Payor, MD;  Location: [facility];  Service: Ophthalmology;  Laterality: Right;  CDE 19.59  . CATARACT EXTRACTION W/PHACO Left 06/01/2014   Procedure: CATARACT EXTRACTION PHACO AND INTRAOCULAR LENS PLACEMENT LEFT EYE;  Surgeon: Gemma Payor, MD;  Location: [facility];  Service:  Ophthalmology;  Laterality: Left;  CDE 19.42  . CORONARY ARTERY BYPASS GRAFT  11/09/00   LIMA to LAD, SVG OM and PLB  . HERNIA REPAIR  1969  . TONSILLECTOMY  Age 43    Current Outpatient Prescriptions  Medication Sig Dispense Refill  . aspirin 81 MG tablet Take 81 mg by mouth daily.    . finasteride (PROSCAR) 5 MG tablet Take 5 mg by mouth daily.    . Niacin CR 1000 MG TBCR Take 1,000 mg by mouth daily.    . simvastatin (ZOCOR) 40 MG tablet Take 40 mg by mouth every evening.    . terazosin (HYTRIN) 5 MG capsule Take 5 mg by mouth at bedtime.     No current facility-administered medications for this visit.    Allergies:  Other   Social History: The patient  reports that he quit smoking about 28 years ago. His smoking use included Cigarettes. He quit after 25.00 years of use. He has never used smokeless tobacco. He reports that he does not drink alcohol or use drugs.   ROS:  Please see the history of present illness. Otherwise, complete review of systems is positive for none.  All other systems are reviewed and negative.   Physical Exam: VS:  BP 124/64 (BP Location: Left Arm)   Pulse 67   Ht  (1.753 m)   Wt 174 lb (78.9 kg)   SpO2 94%   BMI 25.70 kg/m , BMI Body mass index is 25.7 kg/m.  Wt Readings from Last 3 Encounters:  04/09/17 174 lb (78.9 kg)  02/07/16 169 lb (76.7 kg)  01/15/15 162 lb 12.8 oz (73.8 kg)    General: Elderly male, appears comfortable at rest. HEENT: Conjunctiva and lids normal, oropharynx clear. Neck: Supple, no elevated JVP or carotid bruits, no thyromegaly. Lungs: Clear to auscultation, nonlabored breathing at rest. Cardiac: Regular rate and rhythm, no S3, 2/6 apical systolic murmur, no pericardial rub. Abdomen: Soft, nontender, bowel sounds present, no guarding or rebound. Extremities: No pitting edema, distal pulses 2+. Skin: Warm and dry. Musculoskeletal: No kyphosis. Neuropsychiatric: Alert and oriented x3, affect grossly  appropriate.  ECG: I personally reviewed the tracing from 02/07/2016 which showed sinus bradycardia with diffuse nonspecific ST-T wave abnormalities.  Other Studies Reviewed Today:  Lexiscan Cardiolite June 2014: No evidence of ischemia with LVEF 53%.   Abdominal ultrasound 02/14/2016: IMPRESSION: Stable infrarenal abdominal aortic aneurysm with a maximum diameter of 4.0 cm.  Assessment and Plan:  1. Multivessel CAD status post CABG in 2002. Last ischemic workup was in 2014. He has had somewhat progressive dyspnea on exertion over the last few years. We will obtain a follow-up Lexiscan Myoview on medical therapy to reevaluate ischemic burden.  2. Systolic heart murmur, echocardiogram will be obtained for further evaluation.  3. Asymptomatic infrarenal abdominal aortic aneurysm measuring 4.0 cm and stable by ultrasound last her. We will defer reimaging until next year.  4. Hyperlipidemia, and is on Zocor with follow-up per Dr. Margo Aye.  5. Essential hypertension, blood pressure is well controlled today. No changes made in current regimen.  Current medicines were reviewed with the patient today.   Orders Placed This Encounter  Procedures  . NM Myocar Multi W/Spect W/Wall Motion / EF  . EKG 12-Lead  . ECHOCARDIOGRAM COMPLETE    Disposition: Follow-up in one year, sooner if needed.  Signed, Jonelle Sidle, MD, [facility] 04/09/2017 12:43 PM    [facility] at [facility] [address] [place], [place] [address] Phone: [phone]; Fax: [phone]

## 2017-04-09 ENCOUNTER — Ambulatory Visit (INDEPENDENT_AMBULATORY_CARE_PROVIDER_SITE_OTHER): Payer: Medicare Other | Admitting: Cardiology

## 2017-04-09 ENCOUNTER — Encounter: Payer: Self-pay | Admitting: Cardiology

## 2017-04-09 VITALS — BP 124/64 | HR 67 | Ht 69.0 in | Wt 174.0 lb

## 2017-04-09 DIAGNOSIS — I10 Essential (primary) hypertension: Secondary | ICD-10-CM | POA: Diagnosis not present

## 2017-04-09 DIAGNOSIS — R0602 Shortness of breath: Secondary | ICD-10-CM

## 2017-04-09 DIAGNOSIS — E782 Mixed hyperlipidemia: Secondary | ICD-10-CM | POA: Diagnosis not present

## 2017-04-09 DIAGNOSIS — R011 Cardiac murmur, unspecified: Secondary | ICD-10-CM | POA: Diagnosis not present

## 2017-04-09 DIAGNOSIS — I2510 Atherosclerotic heart disease of native coronary artery without angina pectoris: Secondary | ICD-10-CM | POA: Diagnosis not present

## 2017-04-09 NOTE — Patient Instructions (Signed)
Medication Instructions: No changes  Labwork: None ordered  Procedures/Testing: Your physician has requested that you have an echocardiogram. Echocardiography is a painless test that uses sound waves to create images of your heart. It provides your doctor with information about the size and shape of your heart and how well your heart's chambers and valves are working. This procedure takes approximately one hour. There are no restrictions for this procedure.  Your physician has requested that you have a lexiscan myoview. For further information please visit [email or web address] Please follow instruction sheet, as given.    Follow-Up: 1 year Dr.McDowell  Any Additional Special Instructions Will Be Listed Below (If Applicable).     If you need a refill on your cardiac medications before your next appointment, please call your pharmacy.         Thank you for choosing [facility] !

## 2017-04-17 ENCOUNTER — Encounter (HOSPITAL_COMMUNITY): Payer: Self-pay

## 2017-04-17 ENCOUNTER — Encounter (HOSPITAL_COMMUNITY)
Admission: RE | Admit: 2017-04-17 | Discharge: 2017-04-17 | Disposition: A | Discharge status: Home / Self Care | Payer: Medicare Other | Source: Ambulatory Visit | Attending: Cardiology | Admitting: Cardiology

## 2017-04-17 ENCOUNTER — Ambulatory Visit (HOSPITAL_COMMUNITY)
Admission: RE | Admit: 2017-04-17 | Discharge: 2017-04-17 | Disposition: A | Discharge status: Home / Self Care | Payer: Medicare Other | Source: Ambulatory Visit | Attending: Cardiology | Admitting: Cardiology

## 2017-04-17 ENCOUNTER — Encounter (HOSPITAL_BASED_OUTPATIENT_CLINIC_OR_DEPARTMENT_OTHER)
Admission: RE | Admit: 2017-04-17 | Discharge: 2017-04-17 | Disposition: A | Discharge status: Home / Self Care | Payer: Medicare Other | Source: Ambulatory Visit | Attending: Cardiology | Admitting: Cardiology

## 2017-04-17 DIAGNOSIS — R0602 Shortness of breath: Secondary | ICD-10-CM | POA: Diagnosis not present

## 2017-04-17 DIAGNOSIS — I2510 Atherosclerotic heart disease of native coronary artery without angina pectoris: Secondary | ICD-10-CM

## 2017-04-17 DIAGNOSIS — I10 Essential (primary) hypertension: Secondary | ICD-10-CM | POA: Insufficient documentation

## 2017-04-17 DIAGNOSIS — E785 Hyperlipidemia, unspecified: Secondary | ICD-10-CM | POA: Insufficient documentation

## 2017-04-17 LAB — ECHOCARDIOGRAM COMPLETE
AO mean calculated velocity dopler: 138 cm/s
AOPV: 0.71 m/s
AV Area VTI index: 1.21 cm2/m2
AV Area VTI: 2.23 cm2
AV Area mean vel: 1.99 cm2
AV Peak grad: 15 mmHg
AV VEL mean LVOT/AV: 0.63
AV area mean vel ind: 1.01 cm2/m2
AV peak Index: 1.13
AV pk vel: 196 cm/s
AVG: 9 mmHg
CHL CUP AV VEL: 2.38
CHL CUP MV DEC (S): 275
E decel time: 275 msec
E/e' ratio: 11.44
FS: 44 % (ref 28–44)
IVS/LV PW RATIO, ED: 0.91
LA ID, A-P, ES: 37 mm
LA vol index: 27.7 mL/m2
LA vol: 54.7 mL
LADIAMINDEX: 1.88 cm/m2
LAVOLA4C: 51.4 mL
LEFT ATRIUM END SYS DIAM: 37 mm
LV E/e' medial: 11.44
LV E/e'average: 11.44
LV PW d: 11.3 mm — AB (ref 0.6–1.1)
LV TDI E'MEDIAL: 6.2
LV dias vol index: 35 mL/m2
LV dias vol: 70 mL (ref 62–150)
LV sys vol: 24 mL (ref 21–61)
LVELAT: 7.07 cm/s
LVOT VTI: 39.3 cm
LVOT area: 3.14 cm2
LVOT diameter: 20 mm
LVOT peak grad rest: 8 mmHg
LVOT peak vel: 139 cm/s
LVOTSV: 123 mL
LVOTVTI: 0.76 cm
LVSYSVOLIN: 12 mL/m2
MV Peak grad: 3 mmHg
MV pk A vel: 79.5 m/s
MVPKEVEL: 80.9 m/s
RV LATERAL S' VELOCITY: 8.92 cm/s
Simpson's disk: 65
Stroke v: 46 ml
TAPSE: 17.9 mm
TDI e' lateral: 7.07
VTI: 51.8 cm
Valve area index: 1.21
Valve area: 2.38 cm2

## 2017-04-17 LAB — NM MYOCAR MULTI W/SPECT W/WALL MOTION / EF
CHL CUP NUCLEAR SRS: 1
CHL CUP NUCLEAR SSS: 4
LHR: 0.32
LV sys vol: 16 mL
LVDIAVOL: 52 mL (ref 62–150)
NUC STRESS TID: 0.88
Peak HR: 96 {beats}/min
Rest HR: 52 {beats}/min
SDS: 3

## 2017-04-17 MED ORDER — SODIUM CHLORIDE 0.9% FLUSH
INTRAVENOUS | Status: AC
  Administered 2017-04-17: 10 mL via INTRAVENOUS
  Filled 2017-04-17: qty 10

## 2017-04-17 MED ORDER — TECHNETIUM TC 99M TETROFOSMIN IV KIT
10.0000 | PACK | Freq: Once | INTRAVENOUS | Status: AC | PRN
  Administered 2017-04-17: 10.8 via INTRAVENOUS

## 2017-04-17 MED ORDER — REGADENOSON 0.4 MG/5ML IV SOLN
INTRAVENOUS | Status: AC
  Administered 2017-04-17: 0.4 mg via INTRAVENOUS
  Filled 2017-04-17: qty 5

## 2017-04-17 MED ORDER — TECHNETIUM TC 99M TETROFOSMIN IV KIT
30.0000 | PACK | Freq: Once | INTRAVENOUS | Status: AC | PRN
  Administered 2017-04-17: 31 via INTRAVENOUS

## 2017-04-17 NOTE — Progress Notes (Signed)
*  PRELIMINARY RESULTS* Echocardiogram 2D Echocardiogram has been performed.  Stacey Drain 04/17/2017, 9:47 AM

## 2017-07-08 IMAGING — US US AORTA
1 series · 14 of 25 positions shown · non-contrast
Comparison: 01/12/2014

CLINICAL DATA: Abdominal aortic aneurysm

EXAM:
ULTRASOUND OF ABDOMINAL AORTA
TECHNIQUE: Ultrasound examination of the abdominal aorta was performed to
evaluate for abdominal aortic aneurysm.

[Series 1: us aorta · 0.24mm/px · 14 of 30 slices shown]
[im 1/30]
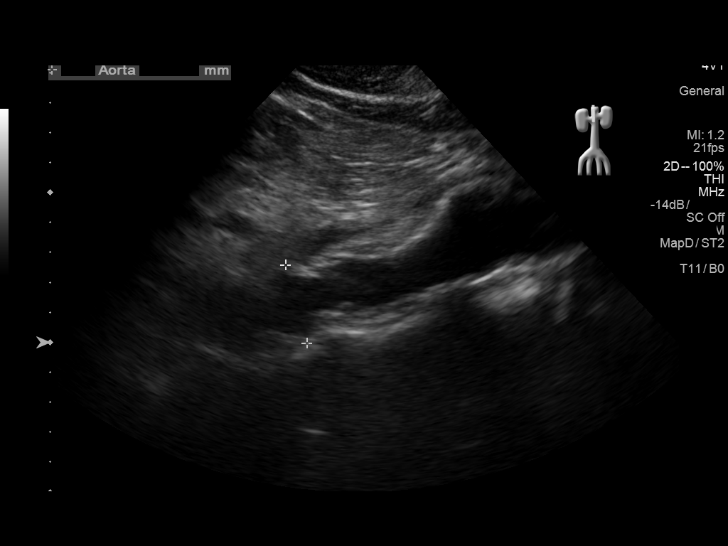
[im 3/30]
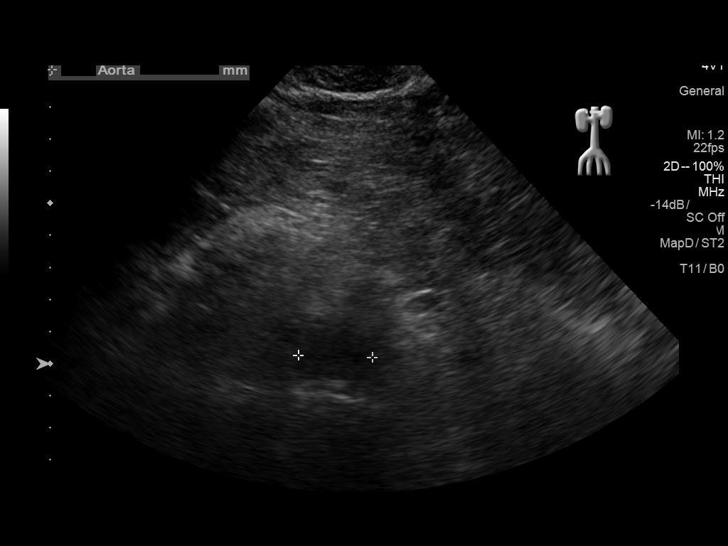
[im 5/30]
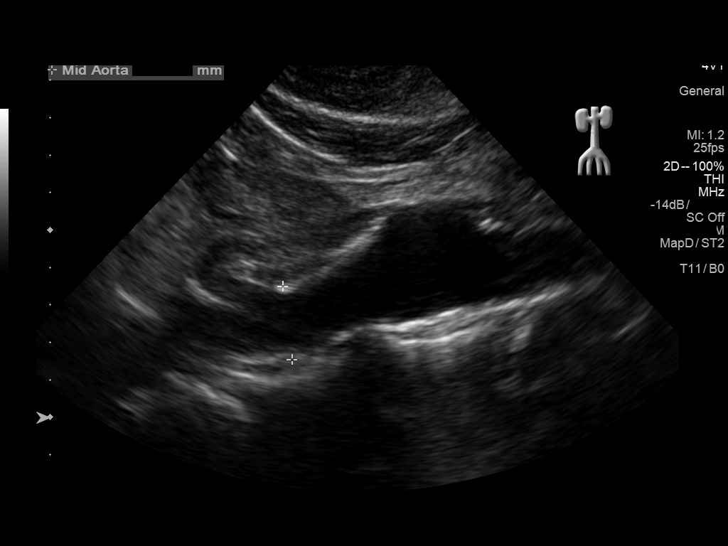
[im 8/30]
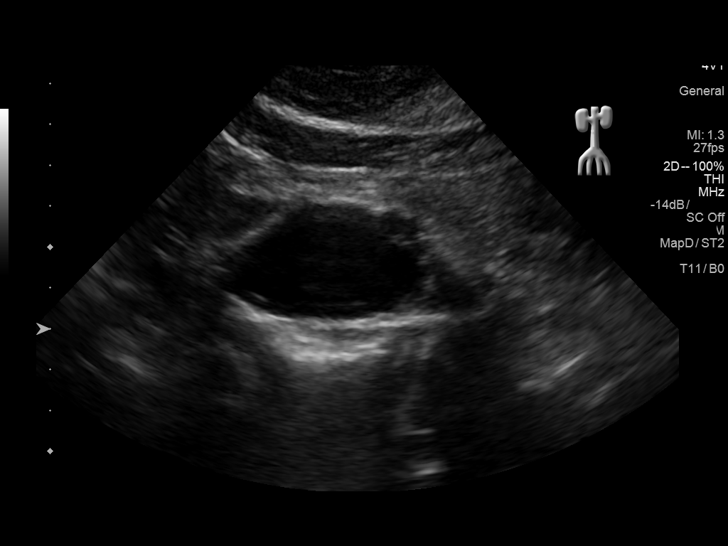
[im 10/30]
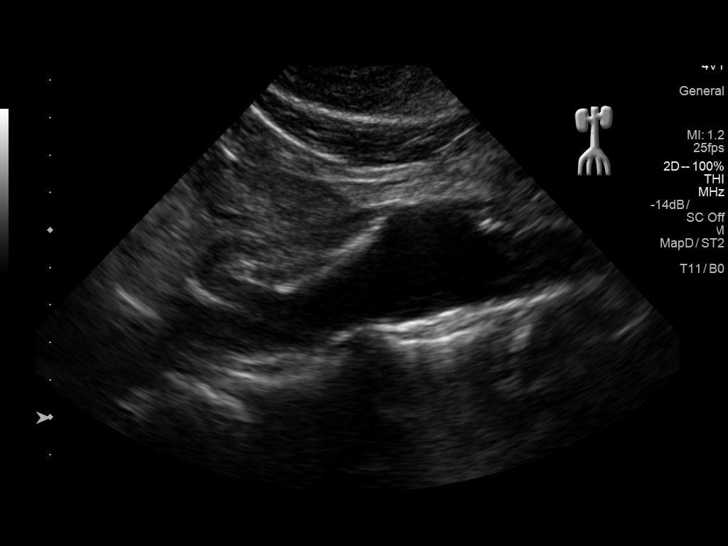
[im 11/30]
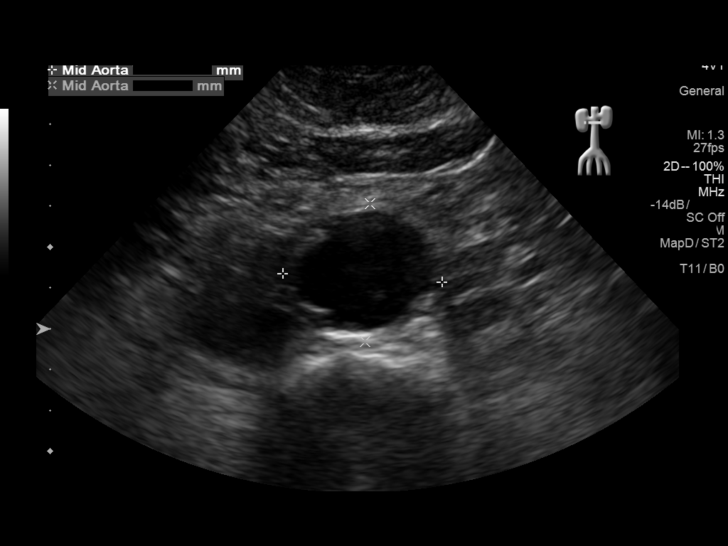
[im 14/30]
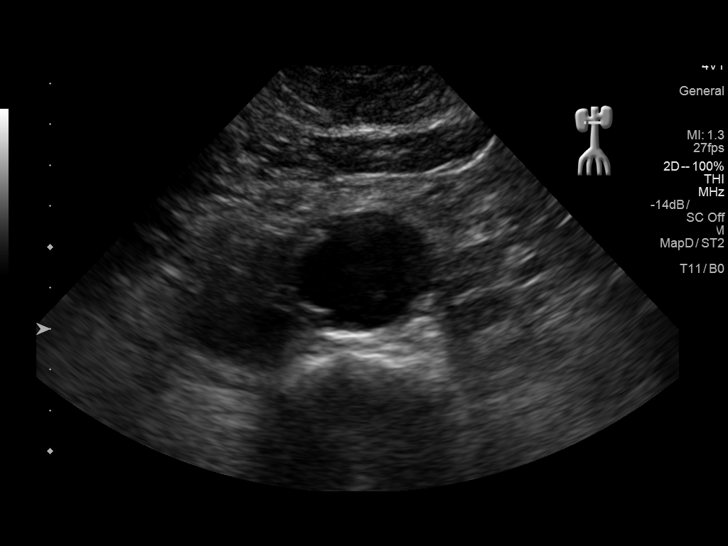
[im 16/30]
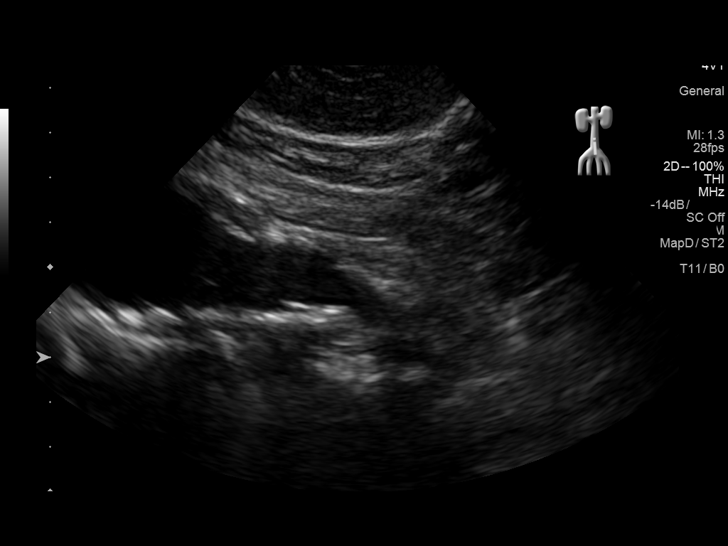
[im 19/30]
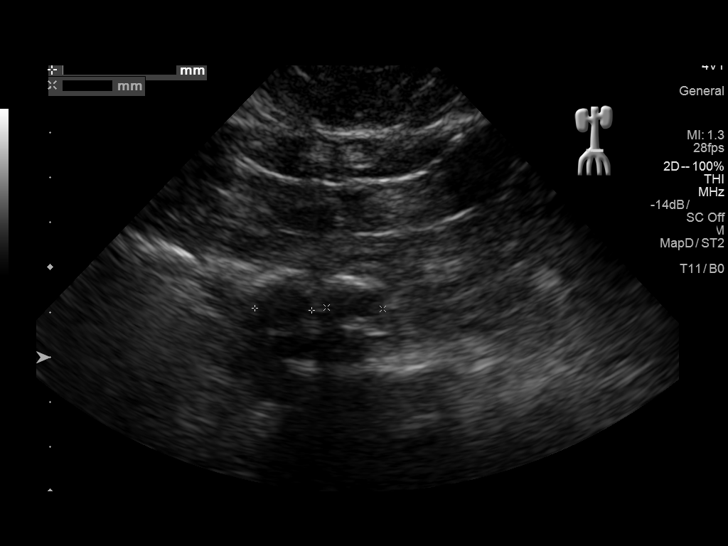
[im 20/30]
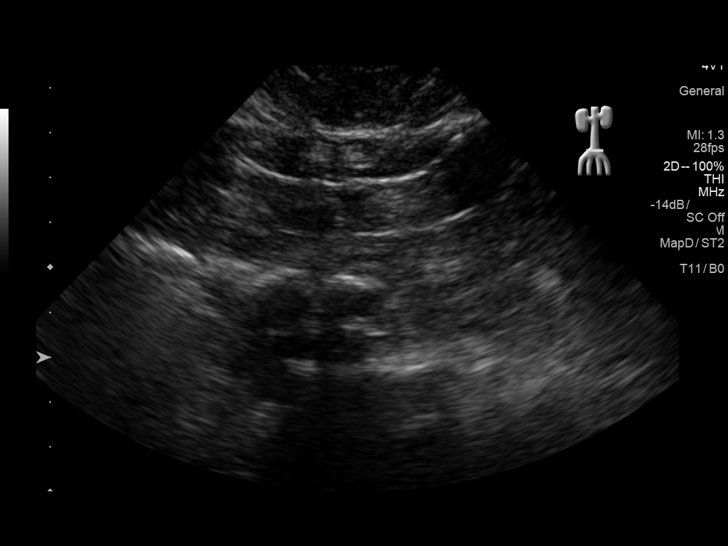
[im 22/30]
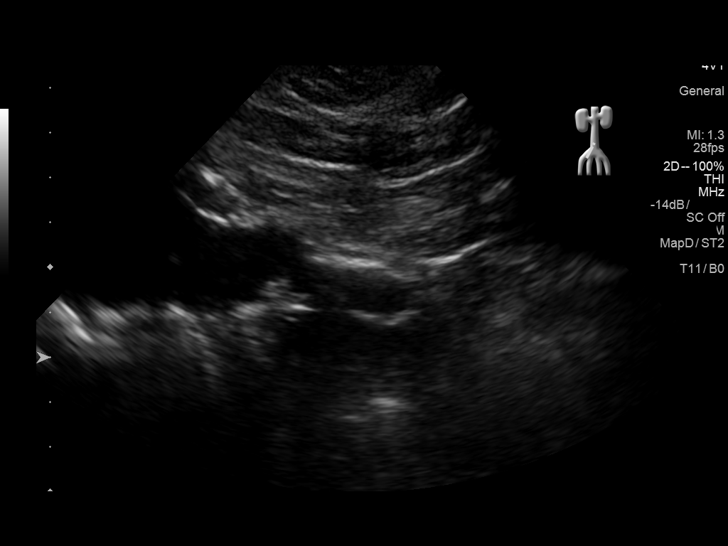
[im 25/30]
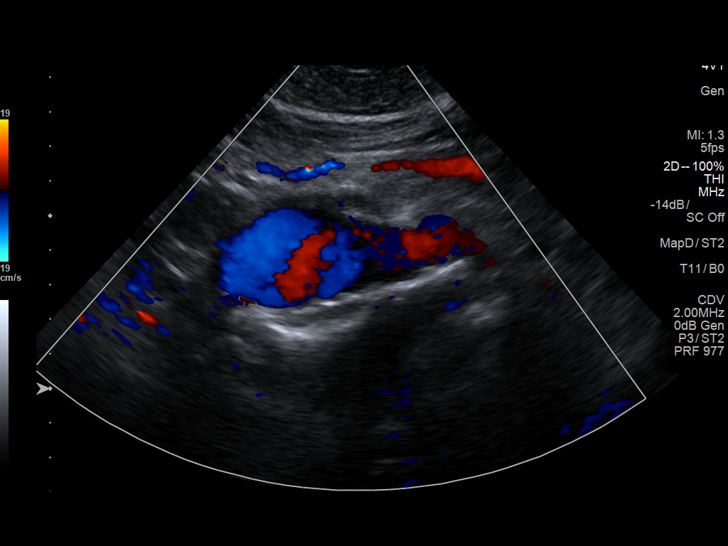
[im 27/30]
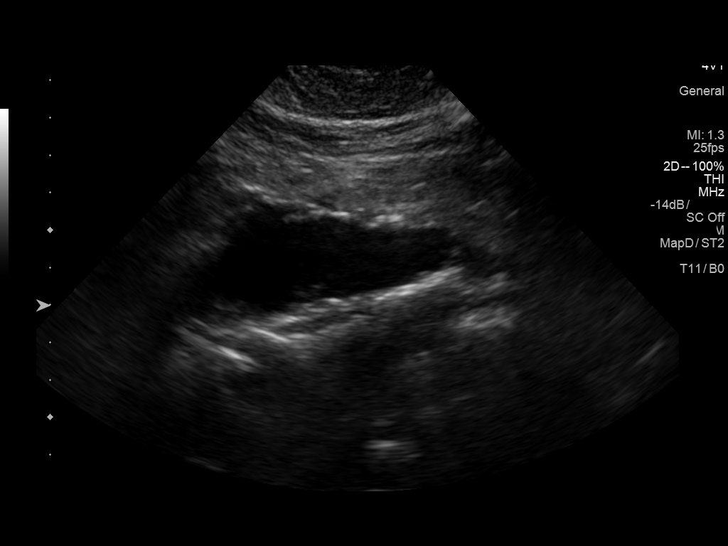
[im 30/30]
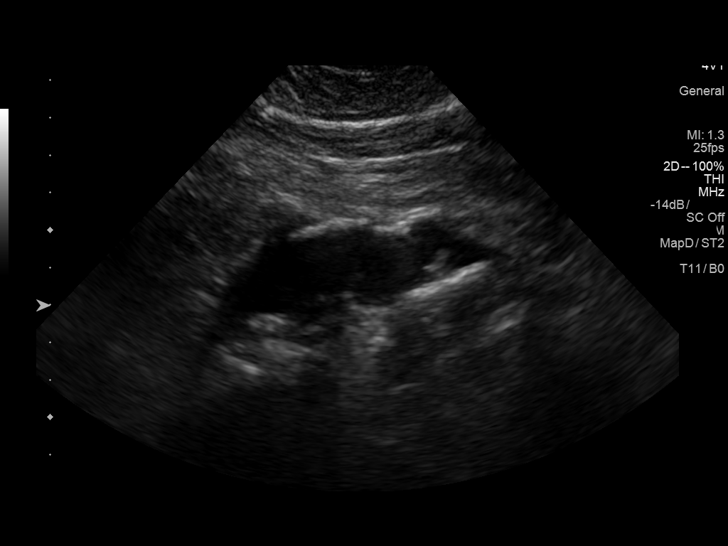

[14 of 25 positions shown; findings below may reference images not displayed]

FINDINGS: Abdominal Aorta

An infrarenal abdominal aortic aneurysm is stable.

Maximum Diameter: 4.0 cm.
IMPRESSION: Stable infrarenal abdominal aortic aneurysm with a maximum diameter
of 4.0 cm.

## 2017-12-20 DIAGNOSIS — E782 Mixed hyperlipidemia: Secondary | ICD-10-CM | POA: Diagnosis not present

## 2017-12-20 DIAGNOSIS — D696 Thrombocytopenia, unspecified: Secondary | ICD-10-CM | POA: Diagnosis not present

## 2017-12-20 DIAGNOSIS — I714 Abdominal aortic aneurysm, without rupture: Secondary | ICD-10-CM | POA: Diagnosis not present

## 2017-12-20 DIAGNOSIS — N429 Disorder of prostate, unspecified: Secondary | ICD-10-CM | POA: Diagnosis not present

## 2017-12-20 DIAGNOSIS — I10 Essential (primary) hypertension: Secondary | ICD-10-CM | POA: Diagnosis not present

## 2017-12-20 DIAGNOSIS — I2510 Atherosclerotic heart disease of native coronary artery without angina pectoris: Secondary | ICD-10-CM | POA: Diagnosis not present

## 2018-04-18 DIAGNOSIS — Z23 Encounter for immunization: Secondary | ICD-10-CM | POA: Diagnosis not present

## 2018-05-15 DIAGNOSIS — I2510 Atherosclerotic heart disease of native coronary artery without angina pectoris: Secondary | ICD-10-CM | POA: Diagnosis not present

## 2018-05-15 DIAGNOSIS — E782 Mixed hyperlipidemia: Secondary | ICD-10-CM | POA: Diagnosis not present

## 2018-05-15 DIAGNOSIS — I10 Essential (primary) hypertension: Secondary | ICD-10-CM | POA: Diagnosis not present

## 2018-05-16 DIAGNOSIS — I2510 Atherosclerotic heart disease of native coronary artery without angina pectoris: Secondary | ICD-10-CM | POA: Diagnosis not present

## 2018-05-16 DIAGNOSIS — R109 Unspecified abdominal pain: Secondary | ICD-10-CM | POA: Diagnosis not present

## 2018-05-16 DIAGNOSIS — I714 Abdominal aortic aneurysm, without rupture: Secondary | ICD-10-CM | POA: Diagnosis not present

## 2018-06-21 DIAGNOSIS — I2510 Atherosclerotic heart disease of native coronary artery without angina pectoris: Secondary | ICD-10-CM | POA: Diagnosis not present

## 2018-06-21 DIAGNOSIS — E782 Mixed hyperlipidemia: Secondary | ICD-10-CM | POA: Diagnosis not present

## 2018-06-21 DIAGNOSIS — I10 Essential (primary) hypertension: Secondary | ICD-10-CM | POA: Diagnosis not present

## 2018-06-28 DIAGNOSIS — Z0000 Encounter for general adult medical examination without abnormal findings: Secondary | ICD-10-CM | POA: Diagnosis not present

## 2018-06-28 DIAGNOSIS — I2510 Atherosclerotic heart disease of native coronary artery without angina pectoris: Secondary | ICD-10-CM | POA: Diagnosis not present

## 2018-06-28 DIAGNOSIS — E782 Mixed hyperlipidemia: Secondary | ICD-10-CM | POA: Diagnosis not present

## 2018-06-28 DIAGNOSIS — I10 Essential (primary) hypertension: Secondary | ICD-10-CM | POA: Diagnosis not present

## 2018-07-02 ENCOUNTER — Encounter: Payer: Self-pay | Admitting: Cardiology

## 2018-07-02 ENCOUNTER — Ambulatory Visit: Payer: Medicare Other | Admitting: Cardiology

## 2018-07-02 VITALS — BP 128/68 | HR 69 | Ht 69.0 in | Wt 170.0 lb

## 2018-07-02 DIAGNOSIS — I25119 Atherosclerotic heart disease of native coronary artery with unspecified angina pectoris: Secondary | ICD-10-CM | POA: Diagnosis not present

## 2018-07-02 DIAGNOSIS — I714 Abdominal aortic aneurysm, without rupture, unspecified: Secondary | ICD-10-CM

## 2018-07-02 DIAGNOSIS — E782 Mixed hyperlipidemia: Secondary | ICD-10-CM

## 2018-07-02 DIAGNOSIS — I350 Nonrheumatic aortic (valve) stenosis: Secondary | ICD-10-CM | POA: Diagnosis not present

## 2018-07-02 NOTE — Patient Instructions (Addendum)
Medication Instructions:  Your physician recommends that you continue on your current medications as directed. Please refer to the Current Medication list given to you today.   If you need a refill on your cardiac medications before your next appointment, please call your pharmacy.   Lab work:   If you have labs (blood work) drawn today and your tests are completely normal, you will receive your results only by: Marland Kitchen. MyChart Message (if you have MyChart) OR . A paper copy in the mail If you have any lab test that is abnormal or we need to change your treatment, we will call you to review the results.  Testing/Procedures:  Your physician has requested that you have an abdominal aorta duplex. During this test, an ultrasound is used to evaluate the aorta. Allow 30 minutes for this exam. Do not eat after midnight the day before and avoid carbonated beverages   Follow-Up: At [facility], you and your health needs are our priority.  As part of our continuing mission to provide you with exceptional heart care, we have created designated Provider Care Teams.  These Care Teams include your primary Cardiologist (physician) and Advanced Practice Providers (APPs -  Physician Assistants and Nurse Practitioners) who all work together to provide you with the care you need, when you need it. You will need a follow up appointment in 1 years.  Please call our office 2 months in advance to schedule this appointment.  You may see Dr.McDowell or one of the following Advanced Practice Providers on your designated Care Team:   [place]rad[place], PA-C [facility]([place] Office) . Jacolyn ReedyMichele Lenze, PA-C [facility]([place] Office)  Any Other Special Instructions Will Be Listed Below (If Applicable). None

## 2018-07-02 NOTE — Progress Notes (Signed)
Cardiology Office Note  Date: 07/02/2018   ID: Ricardo Castillo Kawa, DOB [birth year], MRN [id number]  PCP: Benita StabileHall, John Z, MD  Primary Cardiologist: Nona DellSamuel , MD   Chief Complaint  Patient presents with  . Coronary Artery Disease    History of Present Illness: Ricardo Castillo Cariker is an 82 y.o. male last seen in October 2018.  He is here for a routine follow-up visit.  He does not report any angina symptoms with typical activities.  He burns wood for heat, splits wood outdoors without major limitation.  I reviewed his medications.  Cardiac regimen includes aspirin, Zocor, and niacin.  Follow-up Myoview from last year was low risk without evidence of ischemia.  I reviewed his recent lab work which is outlined below, LDL 70.   I personally reviewed his ECG today which shows sinus rhythm with poor R wave progression rule out old anterior infarct pattern, nonspecific ST changes.  Past Medical History:  Diagnosis Date  . Abdominal aortic aneurysm (HCC)   . COPD (chronic obstructive pulmonary disease) (HCC)   . Coronary atherosclerosis of native coronary artery    Multivessel status post CABG  . Essential hypertension, benign   . Hyperlipidemia   . Mixed hyperlipidemia   . Tobacco use     Past Surgical History:  Procedure Laterality Date  . CATARACT EXTRACTION W/PHACO Right 05/18/2014   Procedure: CATARACT EXTRACTION PHACO AND INTRAOCULAR LENS PLACEMENT (IOC);  Surgeon: Gemma PayorKerry Hunt, MD;  Location: [facility];  Service: Ophthalmology;  Laterality: Right;  CDE 19.59  . CATARACT EXTRACTION W/PHACO Left 06/01/2014   Procedure: CATARACT EXTRACTION PHACO AND INTRAOCULAR LENS PLACEMENT LEFT EYE;  Surgeon: Gemma PayorKerry Hunt, MD;  Location: [facility];  Service: Ophthalmology;  Laterality: Left;  CDE 19.42  . CORONARY ARTERY BYPASS GRAFT  11/09/00   LIMA to LAD, SVG OM and PLB  . HERNIA REPAIR  1969  . TONSILLECTOMY  Age 82    Current Outpatient Medications  Medication Sig Dispense Refill  . aspirin 81  MG tablet Take 81 mg by mouth daily.    . finasteride (PROSCAR) 5 MG tablet Take 5 mg by mouth daily.    . Niacin CR 1000 MG TBCR Take 1,000 mg by mouth daily.    . simvastatin (ZOCOR) 40 MG tablet Take 40 mg by mouth every evening.    . terazosin (HYTRIN) 5 MG capsule Take 5 mg by mouth at bedtime.     No current facility-administered medications for this visit.    Allergies:  Other   Social History: The patient  reports that he quit smoking about 29 years ago. His smoking use included cigarettes. He quit after 25.00 years of use. He has never used smokeless tobacco. He reports that he does not drink alcohol or use drugs.   ROS:  Please see the history of present illness. Otherwise, complete review of systems is positive for none.  All other systems are reviewed and negative.   Physical Exam: VS:  BP 128/68   Pulse 69   Ht 5\' 9"  (1.753 m)   Wt 170 lb (77.1 kg)   SpO2 94%   BMI 25.10 kg/m , BMI Body mass index is 25.1 kg/m.  Wt Readings from Last 3 Encounters:  07/02/18 170 lb (77.1 kg)  04/09/17 174 lb (78.9 kg)  02/07/16 169 lb (76.7 kg)    General: Elderly male, appears comfortable at rest. HEENT: Conjunctiva and lids normal, oropharynx clear. Neck: Supple, no elevated JVP or carotid bruits, no  thyromegaly. Lungs: Clear to auscultation, nonlabored breathing at rest. Cardiac: Regular rate and rhythm, no S3 or significant systolic murmur. Abdomen: Soft, nontender, bowel sounds present. Extremities: No pitting edema, distal pulses 2+. Skin: Warm and dry. Musculoskeletal: No kyphosis. Neuropsychiatric: Alert and oriented x3, affect grossly appropriate.  ECG: I personally reviewed the tracing from 04/09/2017 which showed sinus rhythm with diffuse ST-T wave abnormalities.  Recent Labwork:  December 2019: Hemoglobin 13.9, platelets 148, BUN 14, creatinine 1.08, potassium 4.3, AST 18, ALT 9, cholesterol 142, triglycerides 97, HDL 53, LDL 70  Other Studies Reviewed  Today:  Lexiscan Myoview 04/17/2017:  The study is normal.  This is a low risk study.  The left ventricular ejection fraction is hyperdynamic (>65%).  There was no ST segment deviation noted during stress.   Normal resting and stress perfusion. No ischemia or infarction EF 70%   Abdominal ultrasound 02/14/2016: IMPRESSION: Stable infrarenal abdominal aortic aneurysm with a maximum diameter of 4.0 cm.  Echocardiogram 04/17/2017: Study Conclusions  - Left ventricle: Mild chordal SAM no LVOT gradient recorded can   consider stress echo to try to see if there is a subvalvular   gradient with stress. The cavity size was normal. Systolic   function was normal. The estimated ejection fraction was in the   range of 60% to 65%. Wall motion was normal; there were no   regional wall motion abnormalities. Left ventricular diastolic   function parameters were normal. - Aortic valve: Small gradient across aortic valve with sclerosis   can f/u gradient withecho in 2 years There was very mild   stenosis. Valve area (VTI): 2.38 cm^2. Valve area (Vmax): 2.23   cm^2. Valve area (Vmean): 1.99 cm^2. - Atrial septum: No defect or patent foramen ovale was identified.  Assessment and Plan:  1.  Multivessel CAD status post CABG in 2002.  Follow-up Myoview from last year was low risk and we continue medical therapy with no active angina symptoms.  ECG reviewed.  2.  Asymptomatic infrarenal abdominal aortic aneurysm measuring 4 cm as of 2017.  Follow-up ultrasound will be obtained.  3.  Mixed hyperlipidemia on Zocor and niacin.  Recent LDL 70.  4.  Calcified aortic valve with very mild aortic stenosis, asymptomatic.  Current medicines were reviewed with the patient today.   Orders Placed This Encounter  Procedures  . US ABDOMINAL AORTA SCREENING AAA  . EKG 12-Lead    Disposition: Follow-up in 1 year.  Signed, Jonelle SidleSamuel G. , MD, [facility] 07/02/2018 11:42 AM    [facility]  [facility] at [facility] D/Castillo Aphnn[address], [place], [place] [address] Phone: [phone]; Fax: [phone]

## 2018-07-09 ENCOUNTER — Other Ambulatory Visit: Payer: Self-pay | Admitting: Cardiology

## 2018-07-09 ENCOUNTER — Ambulatory Visit (HOSPITAL_COMMUNITY)
Admission: RE | Admit: 2018-07-09 | Discharge: 2018-07-09 | Disposition: A | Discharge status: Home / Self Care | Payer: Medicare Other | Source: Ambulatory Visit | Attending: Cardiology | Admitting: Cardiology

## 2018-07-09 DIAGNOSIS — I714 Abdominal aortic aneurysm, without rupture, unspecified: Secondary | ICD-10-CM

## 2018-07-17 ENCOUNTER — Telehealth: Payer: Self-pay | Admitting: *Deleted

## 2018-07-17 NOTE — Telephone Encounter (Signed)
-----   Message from Ellsworth LennoxBrittany M Strader, [place]-C sent at 07/09/2018  2:23 PM EST ----- Covering for Dr. Diona BrownerMcDowell - Please let the patient know that his abdominal aortic aneurysm remains stable at 3.9 cm (was at 4.0 cm in 2017). Will plan for repeat imaging in 2 years. Please forward a copy of results to Benita StabileHall, John Z, MD. Thank you.

## 2018-07-17 NOTE — Telephone Encounter (Signed)
Called patient with test results. No answer. Left message to call back.  

## 2018-11-29 DIAGNOSIS — N400 Enlarged prostate without lower urinary tract symptoms: Secondary | ICD-10-CM | POA: Diagnosis not present

## 2018-11-29 DIAGNOSIS — I10 Essential (primary) hypertension: Secondary | ICD-10-CM | POA: Diagnosis not present

## 2018-11-29 DIAGNOSIS — E782 Mixed hyperlipidemia: Secondary | ICD-10-CM | POA: Diagnosis not present

## 2018-11-29 DIAGNOSIS — I2510 Atherosclerotic heart disease of native coronary artery without angina pectoris: Secondary | ICD-10-CM | POA: Diagnosis not present

## 2018-12-17 DIAGNOSIS — I10 Essential (primary) hypertension: Secondary | ICD-10-CM | POA: Diagnosis not present

## 2018-12-17 DIAGNOSIS — E782 Mixed hyperlipidemia: Secondary | ICD-10-CM | POA: Diagnosis not present

## 2018-12-17 DIAGNOSIS — Z125 Encounter for screening for malignant neoplasm of prostate: Secondary | ICD-10-CM | POA: Diagnosis not present

## 2018-12-23 DIAGNOSIS — D696 Thrombocytopenia, unspecified: Secondary | ICD-10-CM | POA: Diagnosis not present

## 2018-12-23 DIAGNOSIS — N400 Enlarged prostate without lower urinary tract symptoms: Secondary | ICD-10-CM | POA: Diagnosis not present

## 2018-12-23 DIAGNOSIS — I714 Abdominal aortic aneurysm, without rupture: Secondary | ICD-10-CM | POA: Diagnosis not present

## 2018-12-23 DIAGNOSIS — E782 Mixed hyperlipidemia: Secondary | ICD-10-CM | POA: Diagnosis not present

## 2018-12-23 DIAGNOSIS — I10 Essential (primary) hypertension: Secondary | ICD-10-CM | POA: Diagnosis not present

## 2018-12-23 DIAGNOSIS — I2510 Atherosclerotic heart disease of native coronary artery without angina pectoris: Secondary | ICD-10-CM | POA: Diagnosis not present

## 2019-02-20 DIAGNOSIS — E782 Mixed hyperlipidemia: Secondary | ICD-10-CM | POA: Diagnosis not present

## 2019-02-20 DIAGNOSIS — I714 Abdominal aortic aneurysm, without rupture: Secondary | ICD-10-CM | POA: Diagnosis not present

## 2019-02-20 DIAGNOSIS — I2510 Atherosclerotic heart disease of native coronary artery without angina pectoris: Secondary | ICD-10-CM | POA: Diagnosis not present

## 2019-02-20 DIAGNOSIS — I10 Essential (primary) hypertension: Secondary | ICD-10-CM | POA: Diagnosis not present

## 2019-02-20 DIAGNOSIS — N400 Enlarged prostate without lower urinary tract symptoms: Secondary | ICD-10-CM | POA: Diagnosis not present

## 2019-02-20 DIAGNOSIS — D696 Thrombocytopenia, unspecified: Secondary | ICD-10-CM | POA: Diagnosis not present

## 2019-05-20 DIAGNOSIS — I2510 Atherosclerotic heart disease of native coronary artery without angina pectoris: Secondary | ICD-10-CM | POA: Diagnosis not present

## 2019-05-20 DIAGNOSIS — I10 Essential (primary) hypertension: Secondary | ICD-10-CM | POA: Diagnosis not present

## 2019-05-20 DIAGNOSIS — E782 Mixed hyperlipidemia: Secondary | ICD-10-CM | POA: Diagnosis not present

## 2019-05-20 DIAGNOSIS — N400 Enlarged prostate without lower urinary tract symptoms: Secondary | ICD-10-CM | POA: Diagnosis not present

## 2019-06-26 DIAGNOSIS — Z125 Encounter for screening for malignant neoplasm of prostate: Secondary | ICD-10-CM | POA: Diagnosis not present

## 2019-06-26 DIAGNOSIS — E782 Mixed hyperlipidemia: Secondary | ICD-10-CM | POA: Diagnosis not present

## 2019-06-26 DIAGNOSIS — I10 Essential (primary) hypertension: Secondary | ICD-10-CM | POA: Diagnosis not present

## 2019-06-30 DIAGNOSIS — E782 Mixed hyperlipidemia: Secondary | ICD-10-CM | POA: Diagnosis not present

## 2019-06-30 DIAGNOSIS — I10 Essential (primary) hypertension: Secondary | ICD-10-CM | POA: Diagnosis not present

## 2019-06-30 DIAGNOSIS — I714 Abdominal aortic aneurysm, without rupture: Secondary | ICD-10-CM | POA: Diagnosis not present

## 2019-06-30 DIAGNOSIS — I2510 Atherosclerotic heart disease of native coronary artery without angina pectoris: Secondary | ICD-10-CM | POA: Diagnosis not present

## 2019-06-30 DIAGNOSIS — D696 Thrombocytopenia, unspecified: Secondary | ICD-10-CM | POA: Diagnosis not present

## 2019-06-30 DIAGNOSIS — N400 Enlarged prostate without lower urinary tract symptoms: Secondary | ICD-10-CM | POA: Diagnosis not present

## 2019-07-02 DIAGNOSIS — I10 Essential (primary) hypertension: Secondary | ICD-10-CM | POA: Diagnosis not present

## 2019-07-02 DIAGNOSIS — D696 Thrombocytopenia, unspecified: Secondary | ICD-10-CM | POA: Diagnosis not present

## 2019-07-02 DIAGNOSIS — I2510 Atherosclerotic heart disease of native coronary artery without angina pectoris: Secondary | ICD-10-CM | POA: Diagnosis not present

## 2019-07-02 DIAGNOSIS — N400 Enlarged prostate without lower urinary tract symptoms: Secondary | ICD-10-CM | POA: Diagnosis not present

## 2019-07-02 DIAGNOSIS — I714 Abdominal aortic aneurysm, without rupture: Secondary | ICD-10-CM | POA: Diagnosis not present

## 2019-07-02 DIAGNOSIS — E7849 Other hyperlipidemia: Secondary | ICD-10-CM | POA: Diagnosis not present

## 2019-07-28 DIAGNOSIS — N400 Enlarged prostate without lower urinary tract symptoms: Secondary | ICD-10-CM | POA: Diagnosis not present

## 2019-07-28 DIAGNOSIS — E7849 Other hyperlipidemia: Secondary | ICD-10-CM | POA: Diagnosis not present

## 2019-07-28 DIAGNOSIS — I10 Essential (primary) hypertension: Secondary | ICD-10-CM | POA: Diagnosis not present

## 2019-07-28 DIAGNOSIS — I714 Abdominal aortic aneurysm, without rupture: Secondary | ICD-10-CM | POA: Diagnosis not present

## 2019-07-28 DIAGNOSIS — D696 Thrombocytopenia, unspecified: Secondary | ICD-10-CM | POA: Diagnosis not present

## 2019-07-28 DIAGNOSIS — I2510 Atherosclerotic heart disease of native coronary artery without angina pectoris: Secondary | ICD-10-CM | POA: Diagnosis not present

## 2019-08-25 DIAGNOSIS — E7849 Other hyperlipidemia: Secondary | ICD-10-CM | POA: Diagnosis not present

## 2019-08-25 DIAGNOSIS — N400 Enlarged prostate without lower urinary tract symptoms: Secondary | ICD-10-CM | POA: Diagnosis not present

## 2019-08-25 DIAGNOSIS — I2510 Atherosclerotic heart disease of native coronary artery without angina pectoris: Secondary | ICD-10-CM | POA: Diagnosis not present

## 2019-08-25 DIAGNOSIS — I10 Essential (primary) hypertension: Secondary | ICD-10-CM | POA: Diagnosis not present

## 2019-10-21 ENCOUNTER — Encounter: Payer: Self-pay | Admitting: Gastroenterology

## 2019-12-01 ENCOUNTER — Encounter (HOSPITAL_COMMUNITY): Payer: Self-pay | Admitting: *Deleted

## 2019-12-01 ENCOUNTER — Other Ambulatory Visit: Payer: Self-pay

## 2019-12-01 ENCOUNTER — Inpatient Hospital Stay (HOSPITAL_COMMUNITY)
Admission: EM | Admit: 2019-12-01 | Discharge: 2019-12-03 | DRG: 280 | Disposition: A | Discharge status: Home / Self Care | Payer: Medicare HMO | Attending: Cardiology | Admitting: Cardiology

## 2019-12-01 ENCOUNTER — Encounter (HOSPITAL_COMMUNITY)
Admission: EM | Disposition: A | Discharge status: Home / Self Care | Payer: Self-pay | Source: Home / Self Care | Attending: Cardiology

## 2019-12-01 ENCOUNTER — Emergency Department (HOSPITAL_COMMUNITY): Payer: Medicare HMO

## 2019-12-01 DIAGNOSIS — Z20822 Contact with and (suspected) exposure to covid-19: Secondary | ICD-10-CM | POA: Diagnosis present

## 2019-12-01 DIAGNOSIS — I2510 Atherosclerotic heart disease of native coronary artery without angina pectoris: Secondary | ICD-10-CM

## 2019-12-01 DIAGNOSIS — I471 Supraventricular tachycardia: Secondary | ICD-10-CM | POA: Diagnosis not present

## 2019-12-01 DIAGNOSIS — Z809 Family history of malignant neoplasm, unspecified: Secondary | ICD-10-CM | POA: Diagnosis not present

## 2019-12-01 DIAGNOSIS — Z87891 Personal history of nicotine dependence: Secondary | ICD-10-CM

## 2019-12-01 DIAGNOSIS — I110 Hypertensive heart disease with heart failure: Secondary | ICD-10-CM | POA: Diagnosis present

## 2019-12-01 DIAGNOSIS — I5021 Acute systolic (congestive) heart failure: Secondary | ICD-10-CM | POA: Diagnosis present

## 2019-12-01 DIAGNOSIS — I25810 Atherosclerosis of coronary artery bypass graft(s) without angina pectoris: Secondary | ICD-10-CM | POA: Diagnosis present

## 2019-12-01 DIAGNOSIS — I714 Abdominal aortic aneurysm, without rupture: Secondary | ICD-10-CM | POA: Diagnosis present

## 2019-12-01 DIAGNOSIS — E7800 Pure hypercholesterolemia, unspecified: Secondary | ICD-10-CM | POA: Diagnosis not present

## 2019-12-01 DIAGNOSIS — I214 Non-ST elevation (NSTEMI) myocardial infarction: Principal | ICD-10-CM | POA: Diagnosis present

## 2019-12-01 DIAGNOSIS — J449 Chronic obstructive pulmonary disease, unspecified: Secondary | ICD-10-CM | POA: Diagnosis present

## 2019-12-01 DIAGNOSIS — E782 Mixed hyperlipidemia: Secondary | ICD-10-CM | POA: Diagnosis present

## 2019-12-01 DIAGNOSIS — Z7982 Long term (current) use of aspirin: Secondary | ICD-10-CM | POA: Diagnosis not present

## 2019-12-01 DIAGNOSIS — I10 Essential (primary) hypertension: Secondary | ICD-10-CM | POA: Diagnosis not present

## 2019-12-01 DIAGNOSIS — R0789 Other chest pain: Secondary | ICD-10-CM | POA: Diagnosis not present

## 2019-12-01 DIAGNOSIS — R079 Chest pain, unspecified: Secondary | ICD-10-CM | POA: Diagnosis present

## 2019-12-01 DIAGNOSIS — Z841 Family history of disorders of kidney and ureter: Secondary | ICD-10-CM | POA: Diagnosis not present

## 2019-12-01 DIAGNOSIS — I340 Nonrheumatic mitral (valve) insufficiency: Secondary | ICD-10-CM | POA: Diagnosis not present

## 2019-12-01 HISTORY — PX: LEFT HEART CATH AND CORS/GRAFTS ANGIOGRAPHY: CATH118250

## 2019-12-01 HISTORY — DX: Essential (primary) hypertension: I10

## 2019-12-01 LAB — BASIC METABOLIC PANEL
Anion gap: 10 (ref 5–15)
BUN: 11 mg/dL (ref 8–23)
CO2: 26 mmol/L (ref 22–32)
Calcium: 9.6 mg/dL (ref 8.9–10.3)
Chloride: 103 mmol/L (ref 98–111)
Creatinine, Ser: 1.13 mg/dL (ref 0.61–1.24)
GFR calc Af Amer: >60 mL/min (ref >60)
GFR calc non Af Amer: >60 mL/min (ref >60)
Glucose, Bld: 115 mg/dL — ABNORMAL HIGH (ref 70–99)
Potassium: 3.8 mmol/L (ref 3.5–5.1)
Sodium: 139 mmol/L (ref 135–145)

## 2019-12-01 LAB — CBG MONITORING, ED: Glucose-Capillary: 110 mg/dL — ABNORMAL HIGH (ref 70–99)

## 2019-12-01 LAB — CBC
HCT: 43.1 % (ref 39.0–52.0)
Hemoglobin: 14.4 g/dL (ref 13.0–17.0)
MCH: 31 pg (ref 26.0–34.0)
MCHC: 33.4 g/dL (ref 30.0–36.0)
MCV: 92.9 fL (ref 80.0–100.0)
Platelets: 155 10*3/uL (ref 150–400)
RBC: 4.64 MIL/uL (ref 4.22–5.81)
RDW: 12.2 % (ref 11.5–15.5)
WBC: 8.6 10*3/uL (ref 4.0–10.5)
nRBC: 0 % (ref 0.0–0.2)

## 2019-12-01 LAB — SARS CORONAVIRUS 2 BY RT PCR (HOSPITAL ORDER, PERFORMED IN ~~LOC~~ HOSPITAL LAB): SARS Coronavirus 2: NEGATIVE

## 2019-12-01 LAB — TROPONIN I (HIGH SENSITIVITY)
Troponin I (High Sensitivity): 13138 ng/L (ref <18)
Troponin I (High Sensitivity): 13754 ng/L (ref <18)

## 2019-12-01 SURGERY — LEFT HEART CATH AND CORS/GRAFTS ANGIOGRAPHY
Anesthesia: LOCAL

## 2019-12-01 MED ORDER — ACETAMINOPHEN 325 MG PO TABS: 650.0000 mg | ORAL_TABLET | ORAL | Status: DC | PRN

## 2019-12-01 MED ORDER — ATORVASTATIN CALCIUM 80 MG PO TABS
80.0000 mg | ORAL_TABLET | Freq: Every day | ORAL | Status: DC
  Administered 2019-12-02 – 2019-12-03 (×3): 80 mg via ORAL
  Filled 2019-12-01 (×3): qty 1

## 2019-12-01 MED ORDER — HEPARIN (PORCINE) IN NACL 1000-0.9 UT/500ML-% IV SOLN
INTRAVENOUS | Status: DC | PRN
  Administered 2019-12-01 (×2): 500 mL

## 2019-12-01 MED ORDER — NITROGLYCERIN IN D5W 200-5 MCG/ML-% IV SOLN
0.0000 ug/min | INTRAVENOUS | Status: DC
  Administered 2019-12-01: 5 ug/min via INTRAVENOUS
  Filled 2019-12-01: qty 250

## 2019-12-01 MED ORDER — SODIUM CHLORIDE 0.9 % IV SOLN: 250.0000 mL | INTRAVENOUS | Status: DC | PRN

## 2019-12-01 MED ORDER — NITROGLYCERIN 0.4 MG SL SUBL
0.4000 mg | SUBLINGUAL_TABLET | SUBLINGUAL | Status: DC | PRN
  Administered 2019-12-01: 0.4 mg via SUBLINGUAL

## 2019-12-01 MED ORDER — FENTANYL CITRATE (PF) 100 MCG/2ML IJ SOLN
INTRAMUSCULAR | Status: DC | PRN
  Administered 2019-12-01: 12.5 ug via INTRAVENOUS

## 2019-12-01 MED ORDER — VERAPAMIL HCL 2.5 MG/ML IV SOLN
INTRAVENOUS | Status: AC
  Filled 2019-12-01: qty 2

## 2019-12-01 MED ORDER — FENTANYL CITRATE (PF) 100 MCG/2ML IJ SOLN
INTRAMUSCULAR | Status: AC
  Filled 2019-12-01: qty 2

## 2019-12-01 MED ORDER — IOHEXOL 350 MG/ML SOLN
INTRAVENOUS | Status: AC
  Filled 2019-12-01: qty 1

## 2019-12-01 MED ORDER — VERAPAMIL HCL 2.5 MG/ML IV SOLN
INTRAVENOUS | Status: DC | PRN
  Administered 2019-12-01: 10 mL via INTRA_ARTERIAL

## 2019-12-01 MED ORDER — HEPARIN (PORCINE) 25000 UT/250ML-% IV SOLN
950.0000 [IU]/h | INTRAVENOUS | Status: DC
  Administered 2019-12-01: 950 [IU]/h via INTRAVENOUS

## 2019-12-01 MED ORDER — ASPIRIN 300 MG RE SUPP: 300.0000 mg | RECTAL | Status: DC

## 2019-12-01 MED ORDER — ASPIRIN EC 81 MG PO TBEC
81.0000 mg | DELAYED_RELEASE_TABLET | Freq: Every day | ORAL | Status: DC
  Administered 2019-12-02 – 2019-12-03 (×2): 81 mg via ORAL
  Filled 2019-12-01 (×2): qty 1

## 2019-12-01 MED ORDER — IOHEXOL 350 MG/ML SOLN
INTRAVENOUS | Status: DC | PRN
  Administered 2019-12-01: 70 mL

## 2019-12-01 MED ORDER — NITROGLYCERIN 0.4 MG SL SUBL
SUBLINGUAL_TABLET | SUBLINGUAL | Status: AC
  Administered 2019-12-01: 0.4 mg via SUBLINGUAL
  Filled 2019-12-01: qty 1

## 2019-12-01 MED ORDER — ONDANSETRON HCL 4 MG/2ML IJ SOLN: 4.0000 mg | Freq: Four times a day (QID) | INTRAMUSCULAR | Status: DC | PRN

## 2019-12-01 MED ORDER — CLOPIDOGREL BISULFATE 300 MG PO TABS
ORAL_TABLET | ORAL | Status: AC
  Filled 2019-12-01: qty 1

## 2019-12-01 MED ORDER — HEPARIN (PORCINE) IN NACL 1000-0.9 UT/500ML-% IV SOLN
INTRAVENOUS | Status: AC
  Filled 2019-12-01: qty 1000

## 2019-12-01 MED ORDER — SODIUM CHLORIDE 0.9% FLUSH: 3.0000 mL | INTRAVENOUS | Status: DC | PRN

## 2019-12-01 MED ORDER — ASPIRIN 81 MG PO CHEW: 324.0000 mg | CHEWABLE_TABLET | ORAL | Status: DC

## 2019-12-01 MED ORDER — CLOPIDOGREL BISULFATE 75 MG PO TABS
75.0000 mg | ORAL_TABLET | Freq: Every day | ORAL | Status: DC
  Administered 2019-12-02 – 2019-12-03 (×2): 75 mg via ORAL
  Filled 2019-12-01 (×2): qty 1

## 2019-12-01 MED ORDER — HEPARIN (PORCINE) 25000 UT/250ML-% IV SOLN
1000.0000 [IU]/h | INTRAVENOUS | Status: DC
  Administered 2019-12-01: 950 [IU]/h via INTRAVENOUS
  Administered 2019-12-02: 1000 [IU]/h via INTRAVENOUS
  Filled 2019-12-01 (×2): qty 250

## 2019-12-01 MED ORDER — HEPARIN SODIUM (PORCINE) 5000 UNIT/ML IJ SOLN
4000.0000 [IU] | Freq: Once | INTRAMUSCULAR | Status: DC
  Filled 2019-12-01: qty 1

## 2019-12-01 MED ORDER — MIDAZOLAM HCL 2 MG/2ML IJ SOLN
INTRAMUSCULAR | Status: DC | PRN
  Administered 2019-12-01: 0.5 mg via INTRAVENOUS

## 2019-12-01 MED ORDER — SODIUM CHLORIDE 0.9 % IV SOLN
INTRAVENOUS | Status: AC | PRN
  Administered 2019-12-01: 10 mL/h via INTRAVENOUS

## 2019-12-01 MED ORDER — LABETALOL HCL 5 MG/ML IV SOLN: 10.0000 mg | INTRAVENOUS | Status: AC | PRN

## 2019-12-01 MED ORDER — LIDOCAINE HCL (PF) 1 % IJ SOLN
INTRAMUSCULAR | Status: DC | PRN
  Administered 2019-12-01: 2 mL

## 2019-12-01 MED ORDER — SODIUM CHLORIDE 0.9% FLUSH
3.0000 mL | Freq: Two times a day (BID) | INTRAVENOUS | Status: DC
  Administered 2019-12-01 – 2019-12-02 (×3): 3 mL via INTRAVENOUS

## 2019-12-01 MED ORDER — SODIUM CHLORIDE 0.9 % IV BOLUS
500.0000 mL | Freq: Once | INTRAVENOUS | Status: AC
  Administered 2019-12-01: 500 mL via INTRAVENOUS

## 2019-12-01 MED ORDER — NITROGLYCERIN 0.4 MG SL SUBL: 0.4000 mg | SUBLINGUAL_TABLET | SUBLINGUAL | Status: DC | PRN

## 2019-12-01 MED ORDER — HEPARIN SODIUM (PORCINE) 1000 UNIT/ML IJ SOLN
INTRAMUSCULAR | Status: AC
  Filled 2019-12-01: qty 1

## 2019-12-01 MED ORDER — IOHEXOL 350 MG/ML SOLN: 100.0000 mL | Freq: Once | INTRAVENOUS | Status: DC | PRN

## 2019-12-01 MED ORDER — ASPIRIN 81 MG PO CHEW
324.0000 mg | CHEWABLE_TABLET | Freq: Once | ORAL | Status: AC
  Administered 2019-12-01: 324 mg via ORAL
  Filled 2019-12-01: qty 4

## 2019-12-01 MED ORDER — LIDOCAINE HCL (PF) 1 % IJ SOLN
INTRAMUSCULAR | Status: AC
  Filled 2019-12-01: qty 30

## 2019-12-01 MED ORDER — HYDRALAZINE HCL 20 MG/ML IJ SOLN: 10.0000 mg | INTRAMUSCULAR | Status: AC | PRN

## 2019-12-01 MED ORDER — CLOPIDOGREL BISULFATE 75 MG PO TABS
300.0000 mg | ORAL_TABLET | Freq: Once | ORAL | Status: AC
  Administered 2019-12-01: 300 mg via ORAL

## 2019-12-01 MED ORDER — SODIUM CHLORIDE 0.9 % IV SOLN: INTRAVENOUS | Status: DC

## 2019-12-01 MED ORDER — ISOSORBIDE MONONITRATE ER 30 MG PO TB24
30.0000 mg | ORAL_TABLET | Freq: Every day | ORAL | Status: DC
  Administered 2019-12-01: 30 mg via ORAL
  Filled 2019-12-01: qty 1

## 2019-12-01 MED ORDER — HEPARIN SODIUM (PORCINE) 1000 UNIT/ML IJ SOLN
INTRAMUSCULAR | Status: DC | PRN
  Administered 2019-12-01: 4000 [IU] via INTRAVENOUS

## 2019-12-01 MED ORDER — HEPARIN BOLUS VIA INFUSION
4000.0000 [IU] | Freq: Once | INTRAVENOUS | Status: AC
  Administered 2019-12-01: 4000 [IU] via INTRAVENOUS

## 2019-12-01 MED ORDER — HEPARIN (PORCINE) 25000 UT/250ML-% IV SOLN
INTRAVENOUS | Status: AC
  Filled 2019-12-01: qty 250

## 2019-12-01 MED ORDER — MIDAZOLAM HCL 2 MG/2ML IJ SOLN
INTRAMUSCULAR | Status: AC
  Filled 2019-12-01: qty 2

## 2019-12-01 SURGICAL SUPPLY — 11 items
CATH INFINITI 5FR AL1 (CATHETERS) ×1 IMPLANT
CATH INFINITI 5FR MULTPACK ANG (CATHETERS) ×1 IMPLANT
DEVICE RAD COMP TR BAND LRG (VASCULAR PRODUCTS) ×1 IMPLANT
GLIDESHEATH SLEND SS 6F .021 (SHEATH) ×1 IMPLANT
GUIDEWIRE INQWIRE 1.5J.035X260 (WIRE) IMPLANT
INQWIRE 1.5J .035X260CM (WIRE) ×2
KIT HEART LEFT (KITS) ×2 IMPLANT
PACK CARDIAC CATHETERIZATION (CUSTOM PROCEDURE TRAY) ×2 IMPLANT
TRANSDUCER W/STOPCOCK (MISCELLANEOUS) ×2 IMPLANT
TUBING CIL FLEX 10 FLL-RA (TUBING) ×2 IMPLANT
WIRE HI TORQ VERSACORE-J 145CM (WIRE) ×1 IMPLANT

## 2019-12-01 NOTE — ED Notes (Signed)
Report given to [facility] in Cath Lab

## 2019-12-01 NOTE — Interval H&P Note (Signed)
History and Physical Interval Note:  12/01/2019 10:59 AM  Ricardo Castillo  has presented today for cardiac catheterization, with the diagnosis of NSTEMI.  The various methods of treatment have been discussed with the patient and family. After consideration of risks, benefits and other options for treatment, the patient has consented to  Procedure(s): LEFT HEART CATH AND CORONARY ANGIOGRAPHY (N/A) as a surgical intervention.  The patient's history has been reviewed, patient examined, no change in status, stable for surgery.  I have reviewed the patient's chart and labs.  Questions were answered to the patient's satisfaction.    Cath Lab Visit (complete for each Cath Lab visit)  Clinical Evaluation Leading to the Procedure:   ACS: Yes.    Non-ACS:  N/A  Christopher End

## 2019-12-01 NOTE — ED Notes (Signed)
Date and time results received: 12/01/19 0850 (use smartphrase ".now" to insert current time)  Test: troponin Critical Value: 13,138  Name of Provider Notified: Dr Effie Shy at [phone]  Orders Received? Or Actions Taken?: no/na

## 2019-12-01 NOTE — ED Notes (Signed)
Date and time results received: 12/01/19 1015 (use smartphrase ".now" to insert current time)  Test: troponin Critical Value: 13,754  Name of Provider Notified: [facility] notified at transport and Dr. Cristal Deer notified at 1019  Orders Received? Or Actions Taken?: no/na

## 2019-12-01 NOTE — ED Provider Notes (Signed)
  Face-to-face evaluation   History: Evaluated by me at 8:06 AM.  He reports chest pain on and off for 2 days, worse last night, persisting all night with pain and left anterior chest and upper abdomen.  Pain has been present since he awoke this morning.  He took his usual nighttime medicines last night but none today.  History of coronary artery bypass, 20 years ago.  He has not seen his cardiologist for about a year.  Physical exam: Alert elderly man, who appears uncomfortable.  Chest nontender to palpation.  Heart regular rate and rhythm without murmur, lungs clear anteriorly.  Abdomen soft nontender, nondistended.  Initial EKG is consistent with old anterior infarct, no acute changes, 7:39 AM  Second EKG done at 8:18 AM, has lateral T wave inversion, no ST segment changes.  8:55 AM-troponin elevated, consistent with NSTEMI.  Medical screening examination/treatment/procedure(s) were conducted as a shared visit with non-physician practitioner(s) and myself.  I personally evaluated the patient during the encounter    Mancel Bale, MD 12/02/19 Ebony Cargo

## 2019-12-01 NOTE — Progress Notes (Signed)
ANTICOAGULATION CONSULT NOTE - Initial Consult  Pharmacy Consult for heparin dosing Indication:   NSTEMI  Allergies  Allergen Reactions  . Other     Pain meds    Patient Measurements: Height: 5\' 9"  (175.3 cm) Weight: 77.1 kg (170 lb) IBW/kg (Calculated) : 70.7 Heparin Dosing Weight: HEPARIN DW (KG): 77.1  Vital Signs: Temp: 98.3 F (36.8 C) (05/24 0749) Temp Source: Oral (05/24 0749) BP: 136/56 (05/24 1450) Pulse Rate: 74 (05/24 1450)  Labs: Recent Labs    12/01/19 0758 12/01/19 0943  HGB 14.4  --   HCT 43.1  --   PLT 155  --   CREATININE 1.13  --   TROPONINIHS 13,138* 13,754*    Estimated Creatinine Clearance: 51.3 mL/min (by C-G formula based on SCr of 1.13 mg/dL).   Medical History: Past Medical History:  Diagnosis Date  . Abdominal aortic aneurysm (HCC)   . COPD (chronic obstructive pulmonary disease) (HCC)   . Coronary atherosclerosis of native coronary artery    Multivessel status post CABG  . Essential hypertension   . Hyperlipidemia   . Mixed hyperlipidemia     Assessment: Pharmacy consulted to dose heparin infusion for this 84 yo male  with NSTEMI. Patient has elevated hsTrI of >13K.94  Baseline CBC is WNL.  Patient wasn't taking any anti-coagulant medications prior to admission.   S/p cath found to have multivessel cad, planning medical management with 48 hours of heparin. Orders to restart heparin this afternoon.   Goal of Therapy:  Heparin level 0.3-0.7 units/ml Monitor platelets by anticoagulation protocol: Yes   Plan:  Restart heparin at 950/hr this afternoon Heparin level in am Plan to stop heparin 5/26 am  6/26 PharmD., BCPS Clinical Pharmacist 12/01/2019 3:32 PM

## 2019-12-01 NOTE — Progress Notes (Signed)
ANTICOAGULATION CONSULT NOTE - Initial Consult  Pharmacy Consult for heparin dosing Indication:   NSTEMI  Allergies  Allergen Reactions  . Other     Pain meds    Patient Measurements: Height: 5\' 9"  (175.3 cm) Weight: 77.1 kg (170 lb) IBW/kg (Calculated) : 70.7 Heparin Dosing Weight: HEPARIN DW (KG): 77.1  Vital Signs: Temp: 98.3 F (36.8 C) (05/24 0749) Temp Source: Oral (05/24 0749) BP: 126/61 (05/24 0902) Pulse Rate: 65 (05/24 0902)  Labs: Recent Labs    12/01/19 0758  HGB 14.4  HCT 43.1  PLT 155  CREATININE 1.13  TROPONINIHS 13,138*    Estimated Creatinine Clearance: 51.3 mL/min (by C-G formula based on SCr of 1.13 mg/dL).   Medical History: Past Medical History:  Diagnosis Date  . Abdominal aortic aneurysm (HCC)   . COPD (chronic obstructive pulmonary disease) (HCC)   . Coronary atherosclerosis of native coronary artery    Multivessel status post CABG  . Essential hypertension   . Hyperlipidemia   . Mixed hyperlipidemia     Assessment: Pharmacy consulted to dose heparin infusion for this 84 yo male  with NSTEMI. Patient has elevated hsTrI of >13K.94  Baseline CBC is WNL.  Patient wasn't taking any anti-coagulant medications prior to admission.   Goal of Therapy:  Heparin level 0.3-0.7 units/ml Monitor platelets by anticoagulation protocol: Yes   Plan:  Give 4000 units bolus x 1 Start heparin infusion at 950 units/hr Check anti-Xa level in 6-8 hours and daily while on heparin Continue to monitor H&H and platelets  Marland Kitchen 12/01/2019,9:21 AM

## 2019-12-01 NOTE — ED Provider Notes (Signed)
[facility] EMERGENCY DEPARTMENT Provider Note   CSN: [id number] Arrival date & time: 12/01/19  0730     History Chief Complaint  Patient presents with  . Chest Pain    Ricardo Castillo is a 84 y.o. male with a history of COPD, CAD with a history of CABG in 2002, hypertension and hyperlipidemia, also has a AAA which is being followed by Dr. Diona Browner, last abdominal ultrasound obtained in 2019 showing a stable 3.9 cm aneurysm, recommendation repeating ultrasound 06/2020, presenting with chest and abdominal pain which started 2 days ago.  He describes constant pressure like pain which is worsened with walking, but started while at rest 2 days ago. The pain radiates into his mid abdomen with intermittent squeezing pain into his left elbow region. He denies radiation of pain into his back.   He has increased shortness of breath and his symptoms worsen with deep inspiration.  He has tried taking Gas X and aspirin which did not improve his symptoms.       HPI     Additional past medical work-up includes the following  Lexiscan Myoview 04/17/2017:  The study is normal.  This is a low risk study.  The left ventricular ejection fraction is hyperdynamic (>65%).  There was no ST segment deviation noted during stress.  Normal resting and stress perfusion. No ischemia or infarction EF 70%    Echocardiogram 04/17/2017: Study Conclusions  - Left ventricle: Mild chordal SAM no LVOT gradient recorded can consider stress echo to try to see if there is a subvalvular gradient with stress. The cavity size was normal. Systolic function was normal. The estimated ejection fraction was in the range of 60% to 65%. Wall motion was normal; there were no regional wall motion abnormalities. Left ventricular diastolic function parameters were normal. - Aortic valve: Small gradient across aortic valve with sclerosis can f/u gradient withecho in 2 years There was very mild stenosis.  Valve area (VTI): 2.38 cm^2. Valve area (Vmax): 2.23 cm^2. Valve area (Vmean): 1.99 cm^2. - Atrial septum: No defect or patent foramen ovale was identified.  Past Medical History:  Diagnosis Date  . Abdominal aortic aneurysm (HCC)   . COPD (chronic obstructive pulmonary disease) (HCC)   . Coronary atherosclerosis of native coronary artery    Multivessel status post CABG  . Essential hypertension   . Hyperlipidemia   . Mixed hyperlipidemia     Patient Active Problem List   Diagnosis Date Noted  . Coronary atherosclerosis of native coronary artery 12/12/2012  . Essential hypertension, benign 12/12/2012  . Hyperlipidemia 12/12/2012  . Diastolic dysfunction 12/12/2012  . AAA (abdominal aortic aneurysm) (HCC) 12/12/2012    Past Surgical History:  Procedure Laterality Date  . CATARACT EXTRACTION W/PHACO Right 05/18/2014   Procedure: CATARACT EXTRACTION PHACO AND INTRAOCULAR LENS PLACEMENT (IOC);  Surgeon: Gemma Payor, MD;  Location: [facility];  Service: Ophthalmology;  Laterality: Right;  CDE 19.59  . CATARACT EXTRACTION W/PHACO Left 06/01/2014   Procedure: CATARACT EXTRACTION PHACO AND INTRAOCULAR LENS PLACEMENT LEFT EYE;  Surgeon: Gemma Payor, MD;  Location: [facility];  Service: Ophthalmology;  Laterality: Left;  CDE 19.42  . CORONARY ARTERY BYPASS GRAFT  11/09/00   LIMA to LAD, SVG OM and PLB  . HERNIA REPAIR  1969  . TONSILLECTOMY  Age 40       Family History  Problem Relation Age of Onset  . Sudden death Mother   . Thrombosis Father   . Cancer Sister   . Sudden death  Maternal Grandmother   . Kidney disease Maternal Grandfather     Social History   Tobacco Use  . Smoking status: Former Smoker    Years: 25.00    Types: Cigarettes    Quit date: 07/10/1988    Years since quitting: 31.4  . Smokeless tobacco: Never Used  Substance Use Topics  . Alcohol use: No    Alcohol/week: 0.0 standard drinks  . Drug use: No    Home Medications Prior to Admission medications    Medication Sig Start Date End Date Taking? Authorizing Provider  aspirin 81 MG tablet Take 81 mg by mouth daily.    [provider]  finasteride (PROSCAR) 5 MG tablet Take 5 mg by mouth daily.    [provider]  Niacin CR 1000 MG TBCR Take 1,000 mg by mouth daily.    [provider]  simvastatin (ZOCOR) 40 MG tablet Take 40 mg by mouth every evening.    [provider]  terazosin (HYTRIN) 5 MG capsule Take 5 mg by mouth at bedtime.    [provider]    Allergies    Other  Review of Systems   Review of Systems  Constitutional: Negative for chills and fever.  HENT: Negative for congestion.   Eyes: Negative.   Respiratory: Positive for shortness of breath. Negative for cough and wheezing.   Cardiovascular: Positive for chest pain.  Gastrointestinal: Positive for abdominal pain and nausea. Negative for constipation, diarrhea and vomiting.  Genitourinary: Negative.   Musculoskeletal: Negative for arthralgias, joint swelling and neck pain.  Skin: Negative.  Negative for rash and wound.  Neurological: Negative for dizziness, weakness, light-headedness, numbness and headaches.  Psychiatric/Behavioral: Negative.     Physical Exam Updated Vital Signs BP 126/61   Pulse 65   Temp 98.3 F (36.8 C) (Oral)   Resp 16   Ht 5\' 9"  (1.753 m)   Wt 77.1 kg   SpO2 97%   BMI 25.10 kg/m   Physical Exam Vitals and nursing note reviewed.  Constitutional:      Appearance: He is well-developed.  HENT:     Head: Normocephalic and atraumatic.  Eyes:     Conjunctiva/sclera: Conjunctivae normal.  Cardiovascular:     Rate and Rhythm: Normal rate and regular rhythm.     Heart sounds: Normal heart sounds.  Pulmonary:     Effort: Pulmonary effort is normal.     Breath sounds: Normal breath sounds. No decreased breath sounds or wheezing.  Abdominal:     General: Abdomen is protuberant. Bowel sounds are normal. There is no abdominal bruit.      Palpations: Abdomen is soft. There is no pulsatile mass.     Tenderness: There is no abdominal tenderness.  Musculoskeletal:        General: Normal range of motion.     Cervical back: Normal range of motion.     Right lower leg: No tenderness. No edema.     Left lower leg: No tenderness. No edema.  Skin:    General: Skin is warm and dry.  Neurological:     Mental Status: He is alert.     ED Results / Procedures / Treatments   Labs (all labs ordered are listed, but only abnormal results are displayed) Labs Reviewed  BASIC METABOLIC PANEL - Abnormal; Notable for the following components:      Result Value   Glucose, Bld 115 (*)    All other components within normal limits  CBG MONITORING, ED -  Abnormal; Notable for the following components:   Glucose-Capillary 110 (*)    All other components within normal limits  TROPONIN I (HIGH SENSITIVITY) - Abnormal; Notable for the following components:   Troponin I (High Sensitivity) 13,138 (*)    All other components within normal limits  SARS CORONAVIRUS 2 BY RT PCR (HOSPITAL ORDER, PERFORMED IN [facility])  CBC    EKG   ED ECG REPORT   Date: 12/01/2019  Rate: 86   Rhythm: normal sinus rhythm  QRS Axis: normal  Intervals: normal  ST/T Wave abnormalities: normal  Conduction Disutrbances:none  Narrative Interpretation: unchanged from 12/19  Old EKG Reviewed: unchanged  I have personally reviewed the EKG tracing and agree with the computerized printout as noted.   ED ECG REPORT   Date: 12/01/2019  Repeated at 8:18  Rate: 76  Rhythm: normal sinus rhythm  QRS Axis: normal  Intervals: normal  ST/T Wave abnormalities: nonspecific T wave changes  Conduction Disutrbances:none  Narrative Interpretation:   Old EKG Reviewed: changes noted  I have personally reviewed the EKG tracing and agree with the computerized printout as noted.   Radiology DG Chest Portable 1 View  Result Date: 12/01/2019 CLINICAL DATA:   Chest pain EXAM: PORTABLE CHEST 1 VIEW COMPARISON:  None. FINDINGS: There is scarring in the left mid lung. The lungs elsewhere are clear. Heart size and pulmonary vascularity are normal. No adenopathy. Patient is status post coronary artery bypass grafting. There is aortic atherosclerosis. No pneumothorax. No bone lesions. IMPRESSION: Scarring left mid lung. Lungs elsewhere clear. Heart size normal. Status post coronary artery bypass grafting. Aortic Atherosclerosis (ICD10-I70.0). Electronically Signed   By: Bretta Bang III M.D.   On: 12/01/2019 08:17    Procedures Procedures (including critical care time)   CRITICAL CARE Performed by: Burgess Amor Total critical care time: 35 minutes Critical care time was exclusive of separately billable procedures and treating other patients. Critical care was necessary to treat or prevent imminent or life-threatening deterioration. Critical care was time spent personally by me on the following activities: development of treatment plan with patient and/or surrogate as well as nursing, discussions with consultants, evaluation of patient's response to treatment, examination of patient, obtaining history from patient or surrogate, ordering and performing treatments and interventions, ordering and review of laboratory studies, ordering and review of radiographic studies, pulse oximetry and re-evaluation of patient's condition.   Medications Ordered in ED Medications  nitroGLYCERIN (NITROSTAT) SL tablet 0.4 mg (0.4 mg Sublingual Given 12/01/19 0846)  iohexol (OMNIPAQUE) 350 MG/ML injection 100 mL (has no administration in time range)  heparin injection 4,000 Units (has no administration in time range)  heparin ADULT infusion 100 units/mL (25000 units/22mL sodium chloride 0.45%) (has no administration in time range)  nitroGLYCERIN 50 mg in dextrose 5 % 250 mL (0.2 mg/mL) infusion (has no administration in time range)  sodium chloride 0.9 % bolus 500 mL (has  no administration in time range)  aspirin chewable tablet 324 mg (324 mg Oral Given 12/01/19 2505)    ED Course  I have reviewed the triage vital signs and the nursing notes.  Pertinent labs & imaging results that were available during my care of the patient were reviewed by me and considered in my medical decision making (see chart for details).    MDM Rules/Calculators/A&P                      Pt with 2 day history of chest pain  with no significant EKG changes but first troponin significantly elevated.  Patient has ruled in for non-STEMI.  Patient was given aspirin while awaiting lab testing.  He was also given nitroglycerin sublingual, after the second tablet his blood pressure dropped to the mid 70s.  He was placed in Trendelenburg and given 500 cc of normal saline after which his blood pressure improved with a systolic of 126.  He was started on heparin drip, nitro drip also ordered with close watch of blood pressures.  Discussed with Dr. Diona Browner who will see patient and arrange for transfer to [facility] cath lab and admission.   Final Clinical Impression(s) / ED Diagnoses Final diagnoses:  NSTEMI (non-ST elevated myocardial infarction) [facility])    Rx / DC Orders ED Discharge Orders    None       Victoriano Lain 12/01/19 8366    Mancel Bale, MD 12/02/19 1904

## 2019-12-01 NOTE — H&P (Addendum)
Cardiology Admission History and Physical:   Patient ID: Ricardo Castillo MRN: [id number]; DOB: 1935/08/24   Admission date: 12/01/2019  Primary Care Provider: Benita Stabile, MD Primary Cardiologist: Nona Dell, MD   Primary Electrophysiologist:  None    Chief Complaint: Chest pain  Patient Profile:   Ricardo Castillo is an 84 y.o. male with history of CAD status post CABG in 2002 admitted with NSTEMI.  History of Present Illness:   Mr. Stroschein has a history of CAD status post CABG x 3 on 11/09/00 with LIMA to the LAD, SVG to OM and PLB, former tobacco abuse, hyperlipidemia, AAA-4.0 cm 2017.  Lexiscan Myoview 2018 no ischemia.  Patient last saw Dr. Diona Browner 06/2018 and doing well.  Patient was doing well until Saturday when he developed chest tightness that was severe at times but would ease with rest.  Chest pain waxed and waned but became unbearable this morning.  He did not sleep all night long suffering from chest tightness and shortness of breath, sweating.  He did not have nitroglycerin to use.   Past Medical History:  Diagnosis Date  . Abdominal aortic aneurysm (HCC)   . COPD (chronic obstructive pulmonary disease) (HCC)   . Coronary atherosclerosis of native coronary artery    Multivessel status post CABG  . Essential hypertension   . Hyperlipidemia   . Mixed hyperlipidemia     Past Surgical History:  Procedure Laterality Date  . CATARACT EXTRACTION W/PHACO Right 05/18/2014   Procedure: CATARACT EXTRACTION PHACO AND INTRAOCULAR LENS PLACEMENT (IOC);  Surgeon: Gemma Payor, MD;  Location: [facility];  Service: Ophthalmology;  Laterality: Right;  CDE 19.59  . CATARACT EXTRACTION W/PHACO Left 06/01/2014   Procedure: CATARACT EXTRACTION PHACO AND INTRAOCULAR LENS PLACEMENT LEFT EYE;  Surgeon: Gemma Payor, MD;  Location: [facility];  Service: Ophthalmology;  Laterality: Left;  CDE 19.42  . CORONARY ARTERY BYPASS GRAFT  11/09/00   LIMA to LAD, SVG OM and PLB  . HERNIA REPAIR  1969  .  TONSILLECTOMY  Age 54     Medications Prior to Admission: Prior to Admission medications   Medication Sig Start Date End Date Taking? Authorizing Provider  aspirin 81 MG tablet Take 81 mg by mouth daily.    [provider]  finasteride (PROSCAR) 5 MG tablet Take 5 mg by mouth daily.    [provider]  Niacin CR 1000 MG TBCR Take 1,000 mg by mouth daily.    [provider]  simvastatin (ZOCOR) 40 MG tablet Take 40 mg by mouth every evening.    [provider]  terazosin (HYTRIN) 5 MG capsule Take 5 mg by mouth at bedtime.    [provider]     Allergies:    Allergies  Allergen Reactions  . Other     Pain meds    Social History:   Social History   Socioeconomic History  . Marital status: Divorced    Spouse name: Not on file  . Number of children: Not on file  . Years of education: Not on file  . Highest education level: Not on file  Occupational History  . Not on file  Tobacco Use  . Smoking status: Former Smoker    Years: 25.00    Types: Cigarettes    Quit date: 07/10/1988    Years since quitting: 31.4  . Smokeless tobacco: Never Used  Substance and Sexual Activity  . Alcohol use: No    Alcohol/week: 0.0 standard drinks  .  Drug use: No  . Sexual activity: Not on file  Other Topics Concern  . Not on file  Social History Narrative  . Not on file   Social Determinants of Health   Financial Resource Strain:   . Difficulty of Paying Living Expenses:   Food Insecurity:   . Worried About Programme researcher, broadcasting/film/video in the Last Year:   . Barista in the Last Year:   Transportation Needs:   . Freight forwarder (Medical):   Marland Kitchen Lack of Transportation (Non-Medical):   Physical Activity:   . Days of Exercise per Week:   . Minutes of Exercise per Session:   Stress:   . Feeling of Stress :   Social Connections:   . Frequency of Communication with Friends and Family:   . Frequency of Social Gatherings with Friends and  Family:   . Attends Religious Services:   . Active Member of Clubs or Organizations:   . Attends Banker Meetings:   Marland Kitchen Marital Status:   Intimate Partner Violence:   . Fear of Current or Ex-Partner:   . Emotionally Abused:   Marland Kitchen Physically Abused:   . Sexually Abused:     Family History:  The patient's family history includes Cancer in his sister; Kidney disease in his maternal grandfather; Sudden death in his maternal grandmother and mother; Thrombosis in his father.    ROS:  Please see the history of present illness.  Review of Systems  Constitution: Positive for diaphoresis.  HENT: Negative.   Cardiovascular: Positive for chest pain and dyspnea on exertion.  Respiratory: Negative.   Endocrine: Negative.   Hematologic/Lymphatic: Negative.   Musculoskeletal: Negative.   Gastrointestinal: Negative.   Genitourinary: Negative.   Neurological: Negative.   All other ROS reviewed and negative.     Physical Exam/Data:   Vitals:   12/01/19 0830 12/01/19 0902 12/01/19 0915 12/01/19 0930  BP: 132/69 126/61  110/76  Pulse: 73 65 75 74  Resp: 18 16 18 15   Temp:      TempSrc:      SpO2: 95% 97% 95% 95%  Weight:      Height:        Intake/Output Summary (Last 24 hours) at 12/01/2019 0953 Last data filed at 12/01/2019 0900 Gross per 24 hour  Intake 500 ml  Output --  Net 500 ml   Last 3 Weights 12/01/2019 07/02/2018 04/09/2017  Weight (lbs) 170 lb 170 lb 174 lb  Weight (kg) 77.111 kg 77.111 kg 78.926 kg     Body mass index is 25.1 kg/m.  General:  Well nourished, well developed, in no acute distress  HEENT: normal Lymph: no adenopathy Neck: no  JVD Endocrine:  No thryomegaly Vascular: No carotid bruits; FA pulses 2+ bilaterally without bruits  Cardiac:  normal S1, S2; RRR; distant heart sounds with 1/6 systolic murmur at the left sternal border Lungs: Decreased breath sounds with scattered wheezes Abd: soft, nontender, no hepatomegaly  Ext: no  edema Musculoskeletal:  No deformities, BUE and BLE strength normal and equal Skin: warm and dry  Neuro:  CNs 2-12 intact, no focal abnormalities noted Psych:  Normal affect    EKG:  The ECG that was done was personally reviewed and demonstrates normal sinus rhythm with poor R wave progression anteriorly and T wave inversion laterally unchanged from EKG in 2019  Relevant CV Studies:  Lexiscan Myoview 04/17/2017:  The study is normal.  This is a low risk study.  The  left ventricular ejection fraction is hyperdynamic (>65%).  There was no ST segment deviation noted during stress.   Normal resting and stress perfusion. No ischemia or infarction EF 70%    Abdominal ultrasound 02/14/2016: IMPRESSION: Stable infrarenal abdominal aortic aneurysm with a maximum diameter of 4.0 cm.   Echocardiogram 04/17/2017: Study Conclusions   - Left ventricle: Mild chordal SAM no LVOT gradient recorded can   consider stress echo to try to see if there is a subvalvular   gradient with stress. The cavity size was normal. Systolic   function was normal. The estimated ejection fraction was in the   range of 60% to 65%. Wall motion was normal; there were no   regional wall motion abnormalities. Left ventricular diastolic   function parameters were normal. - Aortic valve: Small gradient across aortic valve with sclerosis   can f/u gradient withecho in 2 years There was very mild   stenosis. Valve area (VTI): 2.38 cm^2. Valve area (Vmax): 2.23   cm^2. Valve area (Vmean): 1.99 cm^2. - Atrial septum: No defect or patent foramen ovale was identified.    Laboratory Data:  High Sensitivity Troponin:   Recent Labs  Lab 12/01/19 0758  TROPONINIHS 13,138*      Chemistry Recent Labs  Lab 12/01/19 0758  NA 139  K 3.8  CL 103  CO2 26  GLUCOSE 115*  BUN 11  CREATININE 1.13  CALCIUM 9.6  GFRNONAA >60  GFRAA >60  ANIONGAP 10    No results for input(s): PROT, ALBUMIN, AST, ALT, ALKPHOS, BILITOT  in the last 168 hours. Hematology Recent Labs  Lab 12/01/19 0758  WBC 8.6  RBC 4.64  HGB 14.4  HCT 43.1  MCV 92.9  MCH 31.0  MCHC 33.4  RDW 12.2  PLT 155    Radiology/Studies:  DG Chest Portable 1 View  Result Date: 12/01/2019 CLINICAL DATA:  Chest pain EXAM: PORTABLE CHEST 1 VIEW COMPARISON:  None. FINDINGS: There is scarring in the left mid lung. The lungs elsewhere are clear. Heart size and pulmonary vascularity are normal. No adenopathy. Patient is status post coronary artery bypass grafting. There is aortic atherosclerosis. No pneumothorax. No bone lesions. IMPRESSION: Scarring left mid lung. Lungs elsewhere clear. Heart size normal. Status post coronary artery bypass grafting. Aortic Atherosclerosis (ICD10-I70.0). Electronically Signed   By: Bretta Bang III M.D.   On: 12/01/2019 08:17    TIMI Risk Score for Unstable Angina or Non-ST Elevation MI:   The patient's TIMI risk score is 6, which indicates a 41% risk of all cause mortality, new or recurrent myocardial infarction or need for urgent revascularization in the next 14 days.   Assessment and Plan:   NSTEMI with chest pain off and on since Saturday, still having some chest tightness troponins 13,000.  EKG unchanged.  Will transfer to [facility] for emergency cardiac catheterization. I have reviewed the risks, indications, and alternatives to angioplasty and stenting with the patient. Risks include but are not limited to bleeding, infection, vascular injury, stroke, myocardial infection, arrhythmia, kidney injury, radiation-related injury in the case of prolonged fluoroscopy use, emergency cardiac surgery, and death. The patient understands the risks of serious complication is low (<1%) and patient agrees to proceed.   History of CAD status post CABG x3 in 2002 LIMA to the LAD, SVG to OM and PLB  AAA 4.0 in 2017  Hyperlipidemia previously on Zocor and niacin  History of hypertension sling on Hytrin  Mild aortic stenosis  on echo 2019  Severity of Illness: The appropriate patient status for this patient is OBSERVATION. Observation status is judged to be reasonable and necessary in order to provide the required intensity of service to ensure the patient's safety. The patient's presenting symptoms, physical exam findings, and initial radiographic and laboratory data in the context of their medical condition is felt to place them at decreased risk for further clinical deterioration. Furthermore, it is anticipated that the patient will be medically stable for discharge from the hospital within 2 midnights of admission. The following factors support the patient status of observation.   " The patient's presenting symptoms include chest pain. " The physical exam findings include systolic murmur. " The initial radiographic and laboratory data are reviewed.    For questions or updates, please contact [place] Please consult [email or web address] for contact info under     Signed, Ermalinda Barrios, PA-C 12/01/2019 9:53 AM    Attending note:  Patient seen and examined.  I reviewed his records and discussed the case with Ms. Bonnell Public PA-C.  I last saw Mr. Habeeb in the office back in December 2019, stable on medical therapy with history of multivessel CAD status post CABG in 2002.  Lexiscan Myoview in December 2018 was low risk, no significant ischemia and LVEF normal.  He presented to the [facility] this morning complaining of recurring chest discomfort with onset on Saturday.  Reports a tightness in his chest and epigastric area, radiation to the left arm, also intermittent diaphoresis.  Symptoms waxed and waned over the weekend but became worse overnight prompting further assessment.  He states that he did not have any nitroglycerin at home.  On examination he reports mild persistent chest tightness.  He did have transient hypotension following sublingual nitroglycerin, but most recent systolic blood pressure in the 120s and  in sinus rhythm by telemetry which I personally reviewed, heart rate in the 70s.  Lungs are clear, no crackles.  Cardiac exam reveals RRR with soft systolic murmur no gallop.  Skin warm and dry, no peripheral edema.  Pertinent lab work includes potassium 3.8, creatinine 1.13, hemoglobin 14.4, platelets 155, high-sensitivity troponin I 13138, SARS coronavirus 2 test pending (states that he was already vaccinated).  Chest x-ray  reports probable scarring mid lung zone, no other acute changes.  ECG shows sinus rhythm with poor R wave progression anteriorly, inferolateral ST segment depression which is similar to old tracings, subtle ST elevation that is nondiagnostic in aVR.  Patient presents with evidence of NSTEMI.  Heparin initiated in the ER, he is on aspirin and Zocor at home.  Plan is transfer to [facility] for cardiac catheterization, risks and benefits discussed and he is in agreement.  Satira Sark, M.D., F.A.C.C.

## 2019-12-01 NOTE — Progress Notes (Addendum)
TR BAND REMOVAL  LOCATION:    Radial left radial arterial site  DEFLATED PER PROTOCOL:   yes  TIME BAND OFF / DRESSING APPLIED:    1535/gauze and tegaderm  SITE UPON ARRIVAL:    Level 0, small faint bruise  SITE AFTER BAND REMOVAL:    Level 0  CIRCULATION SENSATION AND MOVEMENT:    Within Normal Limits : yes, left hand and fingers warm and pink, sensation present, palpable left radial pulse,resting on pillow  COMMENTS:

## 2019-12-01 NOTE — ED Notes (Signed)
Report given to [facility]. 

## 2019-12-01 NOTE — ED Triage Notes (Signed)
Pt c/o chest pain x 2 days that has gotten progressively worse; pt states the pain started in his left arm and he got diaphoretic; pt states the pain is now in his chest and has had some sob, nausea and weakness

## 2019-12-02 ENCOUNTER — Observation Stay (HOSPITAL_COMMUNITY): Payer: Medicare HMO

## 2019-12-02 DIAGNOSIS — Z841 Family history of disorders of kidney and ureter: Secondary | ICD-10-CM | POA: Diagnosis not present

## 2019-12-02 DIAGNOSIS — I5021 Acute systolic (congestive) heart failure: Secondary | ICD-10-CM | POA: Diagnosis present

## 2019-12-02 DIAGNOSIS — E7800 Pure hypercholesterolemia, unspecified: Secondary | ICD-10-CM | POA: Diagnosis not present

## 2019-12-02 DIAGNOSIS — J449 Chronic obstructive pulmonary disease, unspecified: Secondary | ICD-10-CM | POA: Diagnosis present

## 2019-12-02 DIAGNOSIS — I2510 Atherosclerotic heart disease of native coronary artery without angina pectoris: Secondary | ICD-10-CM | POA: Diagnosis present

## 2019-12-02 DIAGNOSIS — I25810 Atherosclerosis of coronary artery bypass graft(s) without angina pectoris: Secondary | ICD-10-CM | POA: Diagnosis present

## 2019-12-02 DIAGNOSIS — I340 Nonrheumatic mitral (valve) insufficiency: Secondary | ICD-10-CM

## 2019-12-02 DIAGNOSIS — I471 Supraventricular tachycardia: Secondary | ICD-10-CM | POA: Diagnosis not present

## 2019-12-02 DIAGNOSIS — Z809 Family history of malignant neoplasm, unspecified: Secondary | ICD-10-CM | POA: Diagnosis not present

## 2019-12-02 DIAGNOSIS — I10 Essential (primary) hypertension: Secondary | ICD-10-CM | POA: Diagnosis not present

## 2019-12-02 DIAGNOSIS — I110 Hypertensive heart disease with heart failure: Secondary | ICD-10-CM | POA: Diagnosis present

## 2019-12-02 DIAGNOSIS — Z20822 Contact with and (suspected) exposure to covid-19: Secondary | ICD-10-CM | POA: Diagnosis present

## 2019-12-02 DIAGNOSIS — R079 Chest pain, unspecified: Secondary | ICD-10-CM | POA: Diagnosis present

## 2019-12-02 DIAGNOSIS — Z7982 Long term (current) use of aspirin: Secondary | ICD-10-CM | POA: Diagnosis not present

## 2019-12-02 DIAGNOSIS — E782 Mixed hyperlipidemia: Secondary | ICD-10-CM | POA: Diagnosis present

## 2019-12-02 DIAGNOSIS — I214 Non-ST elevation (NSTEMI) myocardial infarction: Secondary | ICD-10-CM | POA: Diagnosis present

## 2019-12-02 DIAGNOSIS — I714 Abdominal aortic aneurysm, without rupture: Secondary | ICD-10-CM | POA: Diagnosis present

## 2019-12-02 DIAGNOSIS — Z87891 Personal history of nicotine dependence: Secondary | ICD-10-CM | POA: Diagnosis not present

## 2019-12-02 LAB — CBC
HCT: 35.3 % — ABNORMAL LOW (ref 39.0–52.0)
Hemoglobin: 11.8 g/dL — ABNORMAL LOW (ref 13.0–17.0)
MCH: 31.4 pg (ref 26.0–34.0)
MCHC: 33.4 g/dL (ref 30.0–36.0)
MCV: 93.9 fL (ref 80.0–100.0)
Platelets: 144 10*3/uL — ABNORMAL LOW (ref 150–400)
RBC: 3.76 MIL/uL — ABNORMAL LOW (ref 4.22–5.81)
RDW: 12.4 % (ref 11.5–15.5)
WBC: 9.1 10*3/uL (ref 4.0–10.5)
nRBC: 0 % (ref 0.0–0.2)

## 2019-12-02 LAB — BASIC METABOLIC PANEL
Anion gap: 10 (ref 5–15)
BUN: 10 mg/dL (ref 8–23)
CO2: 23 mmol/L (ref 22–32)
Calcium: 8.9 mg/dL (ref 8.9–10.3)
Chloride: 106 mmol/L (ref 98–111)
Creatinine, Ser: 0.99 mg/dL (ref 0.61–1.24)
GFR calc Af Amer: >60 mL/min (ref >60)
GFR calc non Af Amer: >60 mL/min (ref >60)
Glucose, Bld: 106 mg/dL — ABNORMAL HIGH (ref 70–99)
Potassium: 3.8 mmol/L (ref 3.5–5.1)
Sodium: 139 mmol/L (ref 135–145)

## 2019-12-02 LAB — HEPARIN LEVEL (UNFRACTIONATED)
Heparin Unfractionated: 0.3 IU/mL (ref 0.30–0.70)
Heparin Unfractionated: 0.45 IU/mL (ref 0.30–0.70)

## 2019-12-02 LAB — LIPID PANEL
Cholesterol: 124 mg/dL (ref 0–200)
HDL: 54 mg/dL (ref >40)
LDL Cholesterol: 61 mg/dL (ref 0–99)
Total CHOL/HDL Ratio: 2.3 RATIO
Triglycerides: 46 mg/dL (ref <150)
VLDL: 9 mg/dL (ref 0–40)

## 2019-12-02 LAB — ECHOCARDIOGRAM COMPLETE
Height: 69 in
Weight: 2632 oz

## 2019-12-02 MED ORDER — METOPROLOL SUCCINATE ER 25 MG PO TB24
25.0000 mg | ORAL_TABLET | Freq: Every day | ORAL | Status: DC
  Administered 2019-12-02 – 2019-12-03 (×2): 25 mg via ORAL
  Filled 2019-12-02 (×2): qty 1

## 2019-12-02 NOTE — Progress Notes (Signed)
ANTICOAGULATION CONSULT NOTE  Pharmacy Consult for heparin dosing Indication:   NSTEMI  Allergies  Allergen Reactions  . Other     Pain meds    Patient Measurements: Height: 5\' 9"  (175.3 cm) Weight: 74.6 kg (164 lb 8 oz) IBW/kg (Calculated) : 70.7 Heparin Dosing Weight: HEPARIN DW (KG): 77.1  Vital Signs: Temp: 99 F (37.2 C) (05/25 0127) Temp Source: Oral (05/25 0127) BP: 127/66 (05/25 0127) Pulse Rate: 62 (05/25 0127)  Labs: Recent Labs    12/01/19 0758 12/01/19 0943 12/02/19 0257  HGB 14.4  --  11.8*  HCT 43.1  --  35.3*  PLT 155  --  144*  HEPARINUNFRC  --   --  0.30  CREATININE 1.13  --   --   TROPONINIHS 13,138* 13,754*  --     Estimated Creatinine Clearance: 51.3 mL/min (by C-G formula based on SCr of 1.13 mg/dL).   Medical History: Past Medical History:  Diagnosis Date  . Abdominal aortic aneurysm (HCC)   . COPD (chronic obstructive pulmonary disease) (HCC)   . Coronary atherosclerosis of native coronary artery    Multivessel status post CABG  . Essential hypertension   . Hyperlipidemia   . Mixed hyperlipidemia     Assessment: Pharmacy consulted to dose heparin infusion for this 84 yo male  with NSTEMI. Patient has elevated hsTrI of >13K.  Baseline CBC is WNL.  Patient wasn't taking any anti-coagulant medications prior to admission.   S/p cath found to have multivessel cad, planning medical management with 48 hours of heparin. Heparin level came back therapeutic at 0.3, on 950 units/hr. Hgb 11.8, plt 144. No s/sx of bleeding or infusion issues.     Goal of Therapy:  Heparin level 0.3-0.7 units/ml Monitor platelets by anticoagulation protocol: Yes   Plan:  Increase heparin to 1000 units/hr to keep in goal range  Order heparin level in 8 hours  Monitor HL, CBC, and for s/sx of bleeding  Plan to stop heparin 5/26 am  6/26, PharmD, BCCCP Clinical Pharmacist  12/02/2019 3:29 AM  Please check AMION for all [facility] phone  numbers After 10:00 PM, call Main Pharmacy [phone]

## 2019-12-02 NOTE — Progress Notes (Signed)
  Echocardiogram 2D Echocardiogram has been performed.  Ricardo Castillo 12/02/2019, 8:36 AM

## 2019-12-02 NOTE — Progress Notes (Signed)
ANTICOAGULATION CONSULT NOTE  Pharmacy Consult for heparin dosing Indication:   NSTEMI  Allergies  Allergen Reactions  . Other     Pain meds    Patient Measurements: Height: 5\' 9"  (175.3 cm) Weight: 74.6 kg (164 lb 8 oz) IBW/kg (Calculated) : 70.7 Heparin Dosing Weight: HEPARIN DW (KG): 77.1  Vital Signs: Temp: 98.6 F (37 C) (05/25 0841) Temp Source: Oral (05/25 0841) BP: 104/54 (05/25 0841) Pulse Rate: 71 (05/25 0841)  Labs: Recent Labs    12/01/19 0758 12/01/19 0943 12/02/19 0257 12/02/19 1339  HGB 14.4  --  11.8*  --   HCT 43.1  --  35.3*  --   PLT 155  --  144*  --   HEPARINUNFRC  --   --  0.30 0.45  CREATININE 1.13  --  0.99  --   TROPONINIHS 13,138* 13,754*  --   --     Estimated Creatinine Clearance: 58.5 mL/min (by C-G formula based on SCr of 0.99 mg/dL).   Medical History: Past Medical History:  Diagnosis Date  . Abdominal aortic aneurysm (HCC)   . COPD (chronic obstructive pulmonary disease) (HCC)   . Coronary atherosclerosis of native coronary artery    Multivessel status post CABG  . Essential hypertension   . Hyperlipidemia   . Mixed hyperlipidemia     Assessment: Pharmacy consulted to dose heparin infusion for this 84 yo male  with NSTEMI. He is now s/p cath found to have multivessel cad, planning medical management with 48 hours of heparin.  -heparin level at goal   Goal of Therapy:  Heparin level 0.3-0.7 units/ml Monitor platelets by anticoagulation protocol: Yes   Plan:  Continue heparin at 1000 units/hr Monitor HL, CBC, and for s/sx of bleeding  Plan to stop heparin 5/26 am  6/26, PharmD Clinical Pharmacist **Pharmacist phone directory can now be found on [email or web address] ([email or web address]).  Listed under [facility].

## 2019-12-02 NOTE — Care Management Obs Status (Signed)
MEDICARE OBSERVATION STATUS NOTIFICATION   Patient Details  Name: Ricardo Castillo MRN: [id number] Date of Birth: Jul 02, 1936   Medicare Observation Status Notification Given:  Yes    Lawerance Sabal, RN 12/02/2019, 9:57 AM

## 2019-12-02 NOTE — Progress Notes (Addendum)
Progress Note  Patient Name: Ricardo Castillo Date of Encounter: 12/02/2019  Primary Cardiologist: Nona Dell, MD   Subjective   No chest pain or dyspnea.   Inpatient Medications    Scheduled Meds: . aspirin  324 mg Oral NOW   Or  . aspirin  300 mg Rectal NOW  . aspirin EC  81 mg Oral Daily  . atorvastatin  80 mg Oral Daily  . clopidogrel  75 mg Oral Q breakfast  . isosorbide mononitrate  30 mg Oral Daily  . sodium chloride flush  3 mL Intravenous Q12H   Continuous Infusions: . sodium chloride    . heparin 1,000 Units/hr (12/02/19 0344)   PRN Meds: sodium chloride, acetaminophen, iohexol, nitroGLYCERIN, ondansetron (ZOFRAN) IV, sodium chloride flush   Vital Signs    Vitals:   12/01/19 2156 12/02/19 0127 12/02/19 0348 12/02/19 0841  BP: (!) 100/59 127/66 (!) 117/48 (!) 104/54  Pulse: 80 62  71  Resp: 15 18 16 16   Temp: 99.6 F (37.6 C) 99 F (37.2 C) 98.9 F (37.2 C) 98.6 F (37 C)  TempSrc: Oral Oral Oral Oral  SpO2: 95%  92% 94%  Weight:  74.6 kg    Height:        Intake/Output Summary (Last 24 hours) at 12/02/2019 0854 Last data filed at 12/02/2019 0657 Gross per 24 hour  Intake 861.38 ml  Output 275 ml  Net 586.38 ml   Last 3 Weights 12/02/2019 12/01/2019 07/02/2018  Weight (lbs) 164 lb 8 oz 170 lb 170 lb  Weight (kg) 74.617 kg 77.111 kg 77.111 kg      Telemetry    SR with brief episode of tachycardia which looks like atrial tach/SVT - Personally Reviewed  ECG    No new tracing   Physical Exam   GEN: No acute distress.   Neck: No JVD Cardiac: RRR, no murmurs, rubs, or gallops. Left radial cath site without hematoma.  Respiratory: Clear to auscultation bilaterally. GI: Soft, nontender, non-distended  MS: No edema; No deformity. Neuro:  Nonfocal  Psych: Normal affect   Labs    High Sensitivity Troponin:   Recent Labs  Lab 12/01/19 0758 12/01/19 0943  TROPONINIHS 13,138* 13,754*      Chemistry Recent Labs  Lab 12/01/19 0758  12/02/19 0257  NA 139 139  K 3.8 3.8  CL 103 106  CO2 26 23  GLUCOSE 115* 106*  BUN 11 10  CREATININE 1.13 0.99  CALCIUM 9.6 8.9  GFRNONAA >60 >60  GFRAA >60 >60  ANIONGAP 10 10     Hematology Recent Labs  Lab 12/01/19 0758 12/02/19 0257  WBC 8.6 9.1  RBC 4.64 3.76*  HGB 14.4 11.8*  HCT 43.1 35.3*  MCV 92.9 93.9  MCH 31.0 31.4  MCHC 33.4 33.4  RDW 12.2 12.4  PLT 155 144*    Radiology    CARDIAC CATHETERIZATION  Result Date: 12/01/2019 Conclusions: 1. Severe native coronary artery disease, including 90% proximal LAD stenosis, chronic total occlusion of the proximal LCx, and sequential 90% and 100% lesions involving non-dominant RCA. 2. Widely patent LIMA-LAD. 3. Patent SVG-D1 with mild disease (~20%). 4. Heavily diseased SVG-OM1 with moderate diffuse proximal and mid graft disease and sequential 99% and 100% stenoses involving the distal graft and anastomosis to OM1.  This is likely the culprit vessel for the patient's NSTEMI. 5. Chronically occluded SVG-LPL. 6. Normal left ventricular filling pressure. Recommendations: 1. Aggressive medical therapy, including 48 hours of IV heparin and 12  months of dual antiplatelet therapy with aspirin and clopidogrel.  I do not believe that PCI to SVG-OM1 would provide much benefit, as Mr. Mabey is asymptomatic at this time on low-dose IV nitroglycerin and onset of symptoms was at least 2 days ago.  Also, the graft is diffusely diseased and supplies a relatively small branch that fills by left-to-left collaterals.  PCI to SVG-OM1 could introduce significant microvascular dysfunction/no-flow through embolization of thrombus and atheroma in the almost 82 year old graft. 2. Secondary prevention, including high-intensity statin therapy. 3. Obtain transthoracic echocardiogram. Yvonne Kendall, MD [facility]   DG Chest Portable 1 View  Result Date: 12/01/2019 CLINICAL DATA:  Chest pain EXAM: PORTABLE CHEST 1 VIEW COMPARISON:  None. FINDINGS:  There is scarring in the left mid lung. The lungs elsewhere are clear. Heart size and pulmonary vascularity are normal. No adenopathy. Patient is status post coronary artery bypass grafting. There is aortic atherosclerosis. No pneumothorax. No bone lesions. IMPRESSION: Scarring left mid lung. Lungs elsewhere clear. Heart size normal. Status post coronary artery bypass grafting. Aortic Atherosclerosis (ICD10-I70.0). Electronically Signed   By: Bretta Bang III M.D.   On: 12/01/2019 08:17    Cardiac Studies   LEFT HEART CATH AND CORS/GRAFTS ANGIOGRAPHY 12/01/19  Conclusion  Conclusions: 1. Severe native coronary artery disease, including 90% proximal LAD stenosis, chronic total occlusion of the proximal LCx, and sequential 90% and 100% lesions involving non-dominant RCA. 2. Widely patent LIMA-LAD. 3. Patent SVG-D1 with mild disease (~20%). 4. Heavily diseased SVG-OM1 with moderate diffuse proximal and mid graft disease and sequential 99% and 100% stenoses involving the distal graft and anastomosis to OM1.  This is likely the culprit vessel for the patient's NSTEMI. 5. Chronically occluded SVG-LPL. 6. Normal left ventricular filling pressure.  Recommendations: 1. Aggressive medical therapy, including 48 hours of IV heparin and 12 months of dual antiplatelet therapy with aspirin and clopidogrel.  I do not believe that PCI to SVG-OM1 would provide much benefit, as Mr. Dente is asymptomatic at this time on low-dose IV nitroglycerin and onset of symptoms was at least 2 days ago.  Also, the graft is diffusely diseased and supplies a relatively small branch that fills by left-to-left collaterals.  PCI to SVG-OM1 could introduce significant microvascular dysfunction/no-flow through embolization of thrombus and atheroma in the almost 82 year old graft. 2. Secondary prevention, including high-intensity statin therapy. 3. Obtain transthoracic echocardiogram.   Diagnostic Dominance:  Left  Intervention    Patient Profile     84 y.o. 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   Assessment & Plan    1.NSTEMI - Hs troponin >13000. Cath as above showing patent LIMA to LAD and patent SVG to D1 with minimal disease. Chronically occluded SVG to LPL. Heavily diseased SVG-OM1 with moderate diffuse proximal and mid graft disease and sequential 99% and 100% stenoses involving the distal graft and anastomosis to OM1.  This is likely the culprit vessel. - Recommended medical therapy with IV heparin for 48 hours and DAPT with ASA and Plavix for 12 months.  - added Imdur and Lipitor.  - echo done, pending reading   2. HLD - 12/02/2019: Cholesterol 124; HDL 54; LDL Cholesterol 61; Triglycerides 46; VLDL 9  -Discontinued home zocor and niacine - Continue high intensity statin   For questions or updates, please contact [facility]  Please consult [email or web address] for contact info under        SignedLeanor Kail, PA  12/02/2019, 8:54 AM

## 2019-12-03 ENCOUNTER — Telehealth: Payer: Self-pay | Admitting: Student

## 2019-12-03 DIAGNOSIS — I471 Supraventricular tachycardia: Secondary | ICD-10-CM

## 2019-12-03 DIAGNOSIS — I10 Essential (primary) hypertension: Secondary | ICD-10-CM

## 2019-12-03 DIAGNOSIS — E7800 Pure hypercholesterolemia, unspecified: Secondary | ICD-10-CM

## 2019-12-03 LAB — CBC
HCT: 37.2 % — ABNORMAL LOW (ref 39.0–52.0)
Hemoglobin: 12.2 g/dL — ABNORMAL LOW (ref 13.0–17.0)
MCH: 31.3 pg (ref 26.0–34.0)
MCHC: 32.8 g/dL (ref 30.0–36.0)
MCV: 95.4 fL (ref 80.0–100.0)
Platelets: 151 10*3/uL (ref 150–400)
RBC: 3.9 MIL/uL — ABNORMAL LOW (ref 4.22–5.81)
RDW: 12.4 % (ref 11.5–15.5)
WBC: 8.3 10*3/uL (ref 4.0–10.5)
nRBC: 0 % (ref 0.0–0.2)

## 2019-12-03 MED ORDER — NITROGLYCERIN 0.4 MG SL SUBL: 0.4000 mg | SUBLINGUAL_TABLET | SUBLINGUAL | 1 refills | Status: DC | PRN

## 2019-12-03 MED ORDER — CLOPIDOGREL BISULFATE 75 MG PO TABS: 75.0000 mg | ORAL_TABLET | Freq: Every day | ORAL | 11 refills | Status: DC

## 2019-12-03 MED ORDER — METOPROLOL SUCCINATE ER 25 MG PO TB24: 25.0000 mg | ORAL_TABLET | Freq: Every day | ORAL | 1 refills | Status: DC

## 2019-12-03 MED ORDER — ATORVASTATIN CALCIUM 80 MG PO TABS: 80.0000 mg | ORAL_TABLET | Freq: Every day | ORAL | 11 refills | Status: DC

## 2019-12-03 MED FILL — METOPROLOL SUCCINATE ER 25: 25 | 30 days supply | Qty: 30 | Fill #0

## 2019-12-03 MED FILL — ATORVASTATIN CALCIUM 80 MG: 80 | 30 days supply | Qty: 30 | Fill #0

## 2019-12-03 MED FILL — NITROGLYCERIN 0.4 MG TAB SL: 0.4 | 8 days supply | Qty: 25 | Fill #0

## 2019-12-03 MED FILL — CLOPIDOGREL 75 MG TABLET: 75 | 30 days supply | Qty: 30 | Fill #0

## 2019-12-03 NOTE — Care Management (Signed)
1127 12-03-19 Patient has transportation via private vehicle from  friend Reita Cliche. No further needs from Case Manager at this time. Gala Lewandowsky, RN,BSN Case Manager

## 2019-12-03 NOTE — Telephone Encounter (Signed)
[id number]  Discharged today from [facility], has f/u apt with Randall An, PA-C on 12/18/19 at 230 pm [place] office

## 2019-12-03 NOTE — Telephone Encounter (Signed)
New Message    Pt has TOC appt 12/18/19 at 2:30pm

## 2019-12-03 NOTE — Discharge Summary (Signed)
Discharge Summary    Patient ID: Ricardo Castillo MRN: [id number]; DOB: [birth year]  Admit date: 12/01/2019 Discharge date: 12/03/2019  Primary Care Provider: Benita Stabile, MD  Primary Cardiologist: Nona Dell, MD   Discharge Diagnoses    Active Problems:   NSTEMI (non-ST elevated myocardial infarction) [facility])   Acute systolic CHF   HLD   CAD   PAT   Diagnostic Studies/Procedures    Echo 12/02/19 1. Left ventricular ejection fraction, by estimation, is 40 to 45%. The  left ventricle has mildly decreased function. The left ventricle  demonstrates regional wall motion abnormalities. Basal to mid  inferolateral and basal to mid inferior akinesis.  There is mild focal basal septal hypertrophy left ventricular hypertrophy  with mitral valve systolic anterior motion. No LV outflow gradient. Left  ventricular diastolic parameters are consistent with Grade I diastolic  dysfunction (impaired relaxation).  2. Right ventricular systolic function is normal. The right ventricular  size is normal. There is normal pulmonary artery systolic pressure. The  estimated right ventricular systolic pressure is 35.7 mmHg.  3. Left atrial size was mildly dilated.  4. The mitral valve is abnormal with mitral valve systolic anterior  motion noted. Mild mitral valve regurgitation. No evidence of mitral  stenosis.  5. The aortic valve is tricuspid. Aortic valve regurgitation is not  visualized. Mild aortic valve sclerosis is present, with no evidence of  aortic valve stenosis.  6. The inferior vena cava is normal in size with greater than 50%  respiratory variability, suggesting right atrial pressure of 3 mmHg.  7. Technically difficult study with poor acoustic windows.    LEFT HEART CATH AND CORS/GRAFTS ANGIOGRAPHY 12/01/19  Conclusion  Conclusions: 1. Severe native coronary artery disease, including 90% proximal LAD stenosis, chronic total occlusion of the proximal LCx, and sequential  90% and 100% lesions involving non-dominant RCA. 2. Widely patent LIMA-LAD. 3. Patent SVG-D1 with mild disease (~20%). 4. Heavily diseased SVG-OM1 with moderate diffuse proximal and mid graft disease and sequential 99% and 100% stenoses involving the distal graft and anastomosis to OM1. This is likely the culprit vessel for the patient's NSTEMI. 5. Chronically occluded SVG-LPL. 6. Normal left ventricular filling pressure.  Recommendations: 1. Aggressive medical therapy, including 48 hours of IV heparin and 12 months of dual antiplatelet therapy with aspirin and clopidogrel. I do not believe that PCI to SVG-OM1 would provide much benefit, as Mr. Ellenwood is asymptomatic at this time on low-dose IV nitroglycerin and onset of symptoms was at least 2 days ago. Also, the graft is diffusely diseased and supplies a relatively small branch that fills by left-to-left collaterals. PCI to SVG-OM1 could introduce significant microvascular dysfunction/no-flow through embolization of thrombus and atheroma in the almost 82 year old graft. 2. Secondary prevention, including high-intensity statin therapy. 3. Obtain transthoracic echocardiogram.   Diagnostic Dominance: Left      History of Present Illness     Ricardo Castillo is a 84 y.o. male withhistory of CAD status post CABG x3 on5/3/02, former tobacco abuse, hyperlipidemia and AAA-4.0 cm 2017 went to [facility] with chest pain.   Lexiscan Myoview 2018 no ischemia.  Patient was doing well until 5/22 when he developed chest tightness that was severe at times but would ease with rest.  Chest pain waxed and waned but became unbearable leading to ER evaluation.  He did not have nitroglycerin to use. Hs troponin 40981 and transferred from [facility] to [facility] with NSTEMI for cath on IV nitro and IV heparin.  Hospital Course     Consultants: None  1.NSTEMI - Hs troponin >13000. Cath as above showing patent LIMA to LAD and patent SVG to D1 with minimal disease.  Chronically occluded SVG to LPL. Heavily diseased SVG-OM1 with moderate diffuse proximal and mid graft disease and sequential 99% and 100% stenoses involving the distal graft and anastomosis to OM1. This is likely the culprit vessel. He was treated with IV heparin for 48 hours. Plan DAPT with ASA and Plavix for 12 months. Echo showed LVEF of 40-45 with WM abnormality. Added BB.   2. Acute systolic CHF - Echo showed LVEF of 40-45%, basal to mid inferolateral and basal to mid inferior akinesis.  Euvolemic. Added BB. BP soft to add ACE/ARB here but consider as outpatient.   3. HLD - 12/02/2019: Cholesterol 124; HDL 54; LDL Cholesterol 61; Triglycerides 46; VLDL 9  -Discontinued home zocor and niacine - Started Lipitor 80mg  qd  4. PAT - Brief run of atrial tachycardia/SVT - Continue BB  Did the patient have an acute coronary syndrome (MI, NSTEMI, STEMI, etc) this admission?:  Yes                               AHA/ACC Clinical Performance & Quality Measures: 1. Aspirin prescribed? - Yes 2. ADP Receptor Inhibitor (Plavix/Clopidogrel, Brilinta/Ticagrelor or Effient/Prasugrel) prescribed (includes medically managed patients)? - YEs 3. Beta Blocker prescribed? - Yes 4. High Intensity Statin (Lipitor 40-80mg  or Crestor 20-40mg ) prescribed? - Yes 5. EF assessed during THIS hospitalization? - Yes 6. For EF <40%, was ACEI/ARB prescribed? - Consider as outpatient, BP soft  7. For EF <40%, Aldosterone Antagonist (Spironolactone or Eplerenone) prescribed? - No - Reason:  Consider as outpatient, BP soft  8. Cardiac Rehab Phase II ordered (including medically managed patients)? - Yes   Discharge Vitals Blood pressure 123/63, pulse 60, temperature 99 F (37.2 C), temperature source Oral, resp. rate 17, height 5\' 9"  (1.753 m), weight 72.8 kg, SpO2 95 %.  Filed Weights   12/01/19 0749 12/02/19 0127 12/03/19 0555  Weight: 77.1 kg 74.6 kg 72.8 kg   Physical Exam  Constitutional: He is oriented to person,  place, and time and well-developed, well-nourished, and in no distress.  HENT:  Head: Normocephalic and atraumatic.  Eyes: Pupils are equal, round, and reactive to light. EOM are normal.  Cardiovascular: Normal rate.  Pulmonary/Chest: Effort normal and breath sounds normal.  Abdominal: Soft. Bowel sounds are normal.  Musculoskeletal:        General: Normal range of motion.     Cervical back: Normal range of motion and neck supple.  Neurological: He is alert and oriented to person, place, and time.  Skin: Skin is warm and dry.  Psychiatric: Affect normal.    Labs & Radiologic Studies    CBC Recent Labs    12/02/19 0257 12/03/19 0520  WBC 9.1 8.3  HGB 11.8* 12.2*  HCT 35.3* 37.2*  MCV 93.9 95.4  PLT 144* 161   Basic Metabolic Panel Recent Labs    12/01/19 0758 12/02/19 0257  NA 139 139  K 3.8 3.8  CL 103 106  CO2 26 23  GLUCOSE 115* 106*  BUN 11 10  CREATININE 1.13 0.99  CALCIUM 9.6 8.9   High Sensitivity Troponin:   Recent Labs  Lab 12/01/19 0758 12/01/19 0943  TROPONINIHS 13,138* 13,754*    Fasting Lipid Panel Recent Labs    12/02/19 0257  CHOL 124  HDL 54  LDLCALC 61  TRIG 46  CHOLHDL 2.3   _____________  CARDIAC CATHETERIZATION  Result Date: 12/01/2019 Conclusions: 1. Severe native coronary artery disease, including 90% proximal LAD stenosis, chronic total occlusion of the proximal LCx, and sequential 90% and 100% lesions involving non-dominant RCA. 2. Widely patent LIMA-LAD. 3. Patent SVG-D1 with mild disease (~20%). 4. Heavily diseased SVG-OM1 with moderate diffuse proximal and mid graft disease and sequential 99% and 100% stenoses involving the distal graft and anastomosis to OM1.  This is likely the culprit vessel for the patient's NSTEMI. 5. Chronically occluded SVG-LPL. 6. Normal left ventricular filling pressure. Recommendations: 1. Aggressive medical therapy, including 48 hours of IV heparin and 12 months of dual antiplatelet therapy with  aspirin and clopidogrel.  I do not believe that PCI to SVG-OM1 would provide much benefit, as Mr. Ewings is asymptomatic at this time on low-dose IV nitroglycerin and onset of symptoms was at least 2 days ago.  Also, the graft is diffusely diseased and supplies a relatively small branch that fills by left-to-left collaterals.  PCI to SVG-OM1 could introduce significant microvascular dysfunction/no-flow through embolization of thrombus and atheroma in the almost 82 year old graft. 2. Secondary prevention, including high-intensity statin therapy. 3. Obtain transthoracic echocardiogram. Yvonne Kendall, MD [facility]   DG Chest Portable 1 View  Result Date: 12/01/2019 CLINICAL DATA:  Chest pain EXAM: PORTABLE CHEST 1 VIEW COMPARISON:  None. FINDINGS: There is scarring in the left mid lung. The lungs elsewhere are clear. Heart size and pulmonary vascularity are normal. No adenopathy. Patient is status post coronary artery bypass grafting. There is aortic atherosclerosis. No pneumothorax. No bone lesions. IMPRESSION: Scarring left mid lung. Lungs elsewhere clear. Heart size normal. Status post coronary artery bypass grafting. Aortic Atherosclerosis (ICD10-I70.0). Electronically Signed   By: Bretta Bang III M.D.   On: 12/01/2019 08:17   ECHOCARDIOGRAM COMPLETE  Result Date: 12/02/2019    ECHOCARDIOGRAM REPORT   Patient Name:   OTT ZIMMERLE Casillas Date of Exam: 12/02/2019 Medical Rec #:  [id number]     Height:       69.0 in Accession #:    [id number]    Weight:       164.5 lb Date of Birth:  April 26, 1936    BSA:          1.901 m Patient Age:    83 years      BP:           117/48 mmHg Patient Gender: M             HR:           62 bpm. Exam Location:  Inpatient Procedure: 2D Echo, Cardiac Doppler and Color Doppler Indications:    NSTEMI I21.4  History:        Patient has prior history of Echocardiogram examinations, most                 recent 04/17/2017. Prior CABG; Risk Factors:Hypertension,                  Dyslipidemia and Former Smoker. AAA.  Sonographer:    Ross Ludwig RDCS (AE) Referring Phys: [phone] CHRISTOPHER END IMPRESSIONS  1. Left ventricular ejection fraction, by estimation, is 40 to 45%. The left ventricle has mildly decreased function. The left ventricle demonstrates regional wall motion abnormalities. Basal to mid inferolateral and basal to mid inferior akinesis. There is mild focal basal septal hypertrophy left ventricular hypertrophy with mitral valve systolic  anterior motion. No LV outflow gradient. Left ventricular diastolic parameters are consistent with Grade I diastolic dysfunction (impaired relaxation).  2. Right ventricular systolic function is normal. The right ventricular size is normal. There is normal pulmonary artery systolic pressure. The estimated right ventricular systolic pressure is 35.7 mmHg.  3. Left atrial size was mildly dilated.  4. The mitral valve is abnormal with mitral valve systolic anterior motion noted. Mild mitral valve regurgitation. No evidence of mitral stenosis.  5. The aortic valve is tricuspid. Aortic valve regurgitation is not visualized. Mild aortic valve sclerosis is present, with no evidence of aortic valve stenosis.  6. The inferior vena cava is normal in size with greater than 50% respiratory variability, suggesting right atrial pressure of 3 mmHg.  7. Technically difficult study with poor acoustic windows. FINDINGS  Left Ventricle: Left ventricular ejection fraction, by estimation, is 40 to 45%. The left ventricle has mildly decreased function. The left ventricle demonstrates regional wall motion abnormalities. The left ventricular internal cavity size was normal in size. There is mild focal basal septal hypertrophy left ventricular hypertrophy. Left ventricular diastolic parameters are consistent with Grade I diastolic dysfunction (impaired relaxation). Right Ventricle: The right ventricular size is normal. No increase in right ventricular wall thickness. Right  ventricular systolic function is normal. There is normal pulmonary artery systolic pressure. The tricuspid regurgitant velocity is 2.86 m/s, and  with an assumed right atrial pressure of 3 mmHg, the estimated right ventricular systolic pressure is 35.7 mmHg. Left Atrium: Left atrial size was mildly dilated. Right Atrium: Right atrial size was normal in size. Pericardium: There is no evidence of pericardial effusion. Mitral Valve: The mitral valve is abnormal. Mild mitral annular calcification. Mild mitral valve regurgitation. No evidence of mitral valve stenosis. MV peak gradient, 3.9 mmHg. The mean mitral valve gradient is 1.0 mmHg. Tricuspid Valve: The tricuspid valve is normal in structure. Tricuspid valve regurgitation is trivial. Aortic Valve: The aortic valve is tricuspid. Aortic valve regurgitation is not visualized. Mild aortic valve sclerosis is present, with no evidence of aortic valve stenosis. Aortic valve mean gradient measures 4.0 mmHg. Aortic valve peak gradient measures 6.7 mmHg. Aortic valve area, by VTI measures 2.75 cm. Pulmonic Valve: The pulmonic valve was normal in structure. Pulmonic valve regurgitation is not visualized. Aorta: The aortic root is normal in size and structure. Venous: The inferior vena cava is normal in size with greater than 50% respiratory variability, suggesting right atrial pressure of 3 mmHg. IAS/Shunts: No atrial level shunt detected by color flow Doppler.  LEFT VENTRICLE PLAX 2D LVIDd:         5.10 cm     Diastology LVIDs:         3.72 cm     LV e' lateral:   6.85 cm/s LV PW:         1.04 cm     LV E/e' lateral: 8.3 LV IVS:        1.06 cm     LV e' medial:    6.42 cm/s LVOT diam:     2.00 cm     LV E/e' medial:  8.8 LV SV:         85 LV SV Index:   45 LVOT Area:     3.14 cm  LV Volumes (MOD) LV vol d, MOD A2C: 94.6 ml LV vol d, MOD A4C: 95.9 ml LV vol s, MOD A2C: 64.3 ml LV vol s, MOD A4C: 62.6 ml LV SV MOD A2C:  30.3 ml LV SV MOD A4C:     95.9 ml LV SV MOD BP:       31.9 ml RIGHT VENTRICLE            IVC RV Basal diam:  2.88 cm    IVC diam: 1.82 cm RV S prime:     8.59 cm/s TAPSE (M-mode): 1.8 cm LEFT ATRIUM             Index       RIGHT ATRIUM           Index LA diam:        3.70 cm 1.95 cm/m  RA Area:     14.10 cm LA Vol (A2C):   77.4 ml 40.71 ml/m RA Volume:   36.20 ml  19.04 ml/m LA Vol (A4C):   48.8 ml 25.67 ml/m LA Biplane Vol: 61.9 ml 32.56 ml/m  AORTIC VALVE AV Area (Vmax):    2.48 cm AV Area (Vmean):   2.60 cm AV Area (VTI):     2.75 cm AV Vmax:           129.00 cm/s AV Vmean:          94.700 cm/s AV VTI:            0.311 m AV Peak Grad:      6.7 mmHg AV Mean Grad:      4.0 mmHg LVOT Vmax:         102.00 cm/s LVOT Vmean:        78.300 cm/s LVOT VTI:          0.272 m LVOT/AV VTI ratio: 0.87  AORTA Ao Root diam: 3.30 cm MITRAL VALVE               TRICUSPID VALVE MV Area (PHT): 2.87 cm    TR Peak grad:   32.7 mmHg MV Peak grad:  3.9 mmHg    TR Vmax:        286.00 cm/s MV Mean grad:  1.0 mmHg MV Vmax:       0.98 m/s    SHUNTS MV Vmean:      50.0 cm/s   Systemic VTI:  0.27 m MV Decel Time: 264 msec    Systemic Diam: 2.00 cm MV E velocity: 56.80 cm/s MV A velocity: 74.40 cm/s MV E/A ratio:  0.76 Marca Ancona MD Electronically signed by Marca Ancona MD Signature Date/Time: 12/02/2019/9:52:56 AM    Final    Disposition   Pt is being discharged home today in good condition.  Follow-up Plans & Appointments    Follow-up Information    [facility], [place]. Go on 12/18/2019.   Specialties: Advice worker, Cardiology Why: @2 :30pm for hospital follow up with Dr. PA Contact information: [address] [place] [id number]            Discharge Medications   Allergies as of 12/03/2019      Reactions   Other    Pain meds      Medication List    STOP taking these medications   Niacin CR 1000 MG Tbcr   simvastatin 40 MG tablet Commonly known as: ZOCOR     TAKE these medications   aspirin 81 MG tablet Take  81 mg by mouth daily.   atorvastatin 80 MG tablet Commonly known as: LIPITOR Take 1 tablet (80 mg total) by mouth daily. Start taking on: Dec 04, 2019   clopidogrel 75 MG tablet Commonly  known as: PLAVIX Take 1 tablet (75 mg total) by mouth daily with breakfast. Start taking on: Dec 04, 2019   finasteride 5 MG tablet Commonly known as: PROSCAR Take 5 mg by mouth daily.   metoprolol succinate 25 MG 24 hr tablet Commonly known as: TOPROL-XL Take 1 tablet (25 mg total) by mouth daily. Start taking on: Dec 04, 2019   nitroGLYCERIN 0.4 MG SL tablet Commonly known as: NITROSTAT Place 1 tablet (0.4 mg total) under the tongue every 5 (five) minutes as needed for chest pain.   terazosin 5 MG capsule Commonly known as: HYTRIN Take 5 mg by mouth at bedtime.          Outstanding Labs/Studies   N/A  Duration of Discharge Encounter   Greater than 30 minutes including physician time.  Lorelei Pont, PA 12/03/2019, 9:24 AM

## 2019-12-05 ENCOUNTER — Other Ambulatory Visit: Payer: Self-pay | Admitting: *Deleted

## 2019-12-05 NOTE — Patient Outreach (Signed)
[facility]) Care Management  12/05/2019  Ricardo Castillo [birth year] [id number]   Transition of Care Referral   Referral Date : 12/05/19 Referral Source: [facility] Notification of Discharge.  Date of Admission : 12/01/19 Diagnosis: NSTEMI  Date of Discharge: 12/03/19 Facility: [facility]  Insurance: [facility]    Outreach Attempt 1# Subjective:  Successful outreach call to patient, HIPPA confirmed x 2 identifiers. Explained reason for the call.  Patient discussed recent hospital admission for chest pain, first going to [facility] this transferred to [facility] . He discussed having heart catheterization procedure and medication changes made.  He denies chest pain or shortness of breath. He states catheter site at wrist without redness, swelling.  He discussed discharge instruction of no heaving lifting x 2 weeks and no driving for one week.   Social  Patient lives at home alone, independent with care, usually drives self to appointments, does his own shopping and cooking.  He does have a friend in the area he can call on. He discussed having a Psychologist, sport and exercise for cooking and eating healthier choices, mostly chicken /fish.   Conditions.  Patient discussed having a history of hypertension but it is under good control. He reports having a blood pressure monitor checks readings infrequently.  He discussed having open heart surgery in 2002 and made changes in diet at that time.   Medications  He uses [facility] order pharmacy for prescriptions, reports new medications filled prior to discharge. Discussed requesting cardiology send new  refills to [facility] for mail order at his visit, he voiced understanding.   Appointments  Cardiology on 12/18/19.  Consent Discussed [name] care management follow up to help with managing chronic conditions and follow up and recent hospital admission, he declines I don't need a thing, I have been managing pretty good . He is agreeable to receiving  outreach letter with contact number and brochure on program he is agreeable.    Plan Will send outreach letter and plan case closure.    Egbert Garibaldi, RN, BSN  [facility] Care Management,Care Management Coordinator  [phone]- Mobile [phone]- Toll Free Main Office

## 2019-12-09 NOTE — Telephone Encounter (Signed)
[id number] 

## 2019-12-16 NOTE — Telephone Encounter (Signed)
[id number] 

## 2019-12-18 ENCOUNTER — Other Ambulatory Visit: Payer: Self-pay

## 2019-12-18 ENCOUNTER — Encounter: Payer: Self-pay | Admitting: Student

## 2019-12-18 ENCOUNTER — Ambulatory Visit: Payer: Medicare HMO | Admitting: Student

## 2019-12-18 VITALS — BP 128/64 | HR 62 | Ht 69.0 in | Wt 165.8 lb

## 2019-12-18 DIAGNOSIS — I25810 Atherosclerosis of coronary artery bypass graft(s) without angina pectoris: Secondary | ICD-10-CM

## 2019-12-18 DIAGNOSIS — I714 Abdominal aortic aneurysm, without rupture, unspecified: Secondary | ICD-10-CM

## 2019-12-18 DIAGNOSIS — I255 Ischemic cardiomyopathy: Secondary | ICD-10-CM | POA: Diagnosis not present

## 2019-12-18 DIAGNOSIS — E785 Hyperlipidemia, unspecified: Secondary | ICD-10-CM

## 2019-12-18 NOTE — Patient Instructions (Signed)
Medication Instructions:   No changes.   Labwork:  Lipid Panel and Liver Function Tests in 6-8 weeks.   Testing/Procedures:  None  Follow-Up:  In 5-6 months with Randall An, PA-C or Dr. Diona Browner  Any Other Special Instructions Will Be Listed Below (If Applicable).     If you need a refill on your cardiac medications before your next appointment, please call your pharmacy.

## 2019-12-18 NOTE — Progress Notes (Signed)
Cardiology Office Note    Date:  12/18/2019   ID:  Ricardo Castillo, DOB September 07, 1935, MRN [id number]  PCP:  Benita Stabile, MD  Cardiologist: Nona Dell, MD    Chief Complaint  Patient presents with   Hospitalization Follow-up    s/p NSTEMI    History of Present Illness:    Ricardo Castillo is a 84 y.o. male with past medical history of CAD (s/p CABG in 2002, NST in 2018 showing no ischemia), HLD, and AAA (at 4.0 cm by imaging in 2017, 3.9 cm by [place] in 06/2018) who presents to the office today for hospital follow-up.   He most recently presented to [facility] ED on 12/01/2019 and reported chest pain starting two days prior to admission which would wax and wane but continued to progress, therefore he came to the ED for further evaluation. He was found to have an NSTEMI with [place] 660[address]. He was started on Heparin and transferred to [facility] for a catheterization. Echocardiogram showed EF was mildly reduced at 40-45% with basal to mid inferolateral and inferior akinesis. RV function was normal and he did have mild MR and mild AV sclerosis without stenosis. He underwent cardiac catheterization by Dr. Okey Dupre which showed severe native vessel CAD with patent LIMA-LAD, patent SVG-D1, chronically occluded SVG-PDA and diseased SVG-OM1 with moderate diffuse proximal and mid graft disease and sequential 99% and 100% stenoses involving the distal graft and anastomosis to OM1. It was not felt PCI to the SVG-OM1 would provide benefit given symptom onset > 48 hours and it supplied a relatively small branch that was filled by left-to-left collaterals. Was recommended to treat with IV Heparin for a full 48 hours and continue DAPT with ASA and Plavix. Simvastatin was changed to Atorvastatin 80mg  daily and he was started on Toprol-XL 25mg  daily. Was not started on ACE-I/ARB given his soft BP.    In talking with the patient today, he reports overall doing well since his recent hospitalization. He  denies any recurrent chest pain and breathing feels back to baseline. No specific orthopnea, PND or lower extremity edema. He does report some dizziness with positional changes and says this has been occurring for several years. He did have hypotension during his admission but BP is stable at 128/64 during today's visit.  Past Medical History:  Diagnosis Date   Abdominal aortic aneurysm (HCC)    COPD (chronic obstructive pulmonary disease) (HCC)    Coronary atherosclerosis of native coronary artery    a. s/p CABG in 2002 b. cath in 11/2019 showing patent LIMA-LAD, patent SVG-D1, CTO of SVG-PDA and stenosis along SVG-OM1 wih collaterals noted and med management recommended.    Essential hypertension    Hyperlipidemia    Mixed hyperlipidemia     Past Surgical History:  Procedure Laterality Date   CATARACT EXTRACTION W/PHACO Right 05/18/2014   Procedure: CATARACT EXTRACTION PHACO AND INTRAOCULAR LENS PLACEMENT (IOC);  Surgeon: 12/2019, MD;  Location: [facility];  Service: Ophthalmology;  Laterality: Right;  CDE 19.59   CATARACT EXTRACTION W/PHACO Left 06/01/2014   Procedure: CATARACT EXTRACTION PHACO AND INTRAOCULAR LENS PLACEMENT LEFT EYE;  Surgeon: Gemma Payor, MD;  Location: [facility] OR[facility];  Service: Ophthalmology;  Laterality: Left;  CDE 19.42   CORONARY ARTERY BYPASS GRAFT  11/09/00   LIMA to LAD, SVG OM and PLB   HERNIA REPAIR  1969   LEFT HEART CATH AND CORS/GRAFTS ANGIOGRAPHY N/A 12/01/2019   Procedure: LEFT HEART CATH AND  CORS/GRAFTS ANGIOGRAPHY;  Surgeon: Yvonne Kendall, MD;  Location: [facility] INVASIVE CV LAB;  Service: Cardiovascular;  Laterality: N/A;   TONSILLECTOMY  Age 45    Current Medications: Outpatient Medications Prior to Visit  Medication Sig Dispense Refill   aspirin 81 MG tablet Take 81 mg by mouth daily.     atorvastatin (LIPITOR) 80 MG tablet Take 1 tablet (80 mg total) by mouth daily. 30 tablet 11   clopidogrel (PLAVIX) 75 MG tablet Take 1 tablet (75 mg total) by  mouth daily with breakfast. 30 tablet 11   finasteride (PROSCAR) 5 MG tablet Take 5 mg by mouth daily.     metoprolol succinate (TOPROL-XL) 25 MG 24 hr tablet Take 1 tablet (25 mg total) by mouth daily. 30 tablet 1   nitroGLYCERIN (NITROSTAT) 0.4 MG SL tablet Place 1 tablet (0.4 mg total) under the tongue every 5 (five) minutes as needed for chest pain. 25 tablet 1   terazosin (HYTRIN) 5 MG capsule Take 5 mg by mouth at bedtime.     No facility-administered medications prior to visit.     Allergies:   Other   Social History   Socioeconomic History   Marital status: Divorced    Spouse name: Not on file   Number of children: Not on file   Years of education: Not on file   Highest education level: Not on file  Occupational History   Not on file  Tobacco Use   Smoking status: Former Smoker    Years: 25.00    Types: Cigarettes    Quit date: 07/10/1988    Years since quitting: 31.4   Smokeless tobacco: Never Used  Building services engineer Use: Never used  Substance and Sexual Activity   Alcohol use: No    Alcohol/week: 0.0 standard drinks   Drug use: No   Sexual activity: Not on file  Other Topics Concern   Not on file  Social History Narrative   Not on file   Social Determinants of Health   Financial Resource Strain:    Difficulty of Paying Living Expenses:   Food Insecurity:    Worried About Programme researcher, broadcasting/film/video in the Last Year:    Barista in the Last Year:   Transportation Needs:    Freight forwarder (Medical):    Lack of Transportation (Non-Medical):   Physical Activity:    Days of Exercise per Week:    Minutes of Exercise per Session:   Stress:    Feeling of Stress :   Social Connections:    Frequency of Communication with Friends and Family:    Frequency of Social Gatherings with Friends and Family:    Attends Religious Services:    Active Member of Clubs or Organizations:    Attends Engineer, structural:     Marital Status:      Family History:  The patient's family history includes Cancer in his sister; Kidney disease in his maternal grandfather; Sudden death in his maternal grandmother and mother; Thrombosis in his father.   Review of Systems:   Please see the history of present illness.     General:  No chills, fever, night sweats or weight changes. Positive for dizziness.  Cardiovascular:  No chest pain, dyspnea on exertion, edema, orthopnea, palpitations, paroxysmal nocturnal dyspnea. Dermatological: No rash, lesions/masses Respiratory: No cough, dyspnea Urologic: No hematuria, dysuria Abdominal:   No nausea, vomiting, diarrhea, bright red blood per rectum, melena, or hematemesis Neurologic:  No  visual changes, wkns, changes in mental status. All other systems reviewed and are otherwise negative except as noted above.   Physical Exam:    VS:  BP 128/64    Pulse 62    Ht 5\' 9"  (1.753 m)    Wt 165 lb 12.8 oz (75.2 kg)    SpO2 94%    BMI 24.48 kg/m    General: Well developed, well nourished,male appearing in no acute distress. Head: Normocephalic, atraumatic, sclera non-icteric.  Neck: No carotid bruits. JVD not elevated.  Lungs: Respirations regular and unlabored, without wheezes or rales.  Heart: Regular rate and rhythm. No S3 or S4.  No murmur, no rubs, or gallops appreciated. Abdomen: Soft, non-tender, non-distended. No obvious abdominal masses. Msk:  Strength and tone appear normal for age. No obvious joint deformities or effusions. Extremities: No clubbing or cyanosis. No lower extremity edema.  Distal pedal pulses are 2+ bilaterally. Radial cath site stable without ecchymosis or evidence of a hematoma.  Neuro: Alert and oriented X 3. Moves all extremities spontaneously. No focal deficits noted. Psych:  Responds to questions appropriately with a normal affect. Skin: No rashes or lesions noted  Wt Readings from Last 3 Encounters:  12/18/19 165 lb 12.8 oz (75.2 kg)  12/03/19 160  lb 6.4 oz (72.8 kg)  07/02/18 170 lb (77.1 kg)     Studies/Labs Reviewed:   EKG:  EKG is not ordered today.   Recent Labs: 12/02/2019: BUN 10; Creatinine, Ser 0.99; Potassium 3.8; Sodium 139 12/03/2019: Hemoglobin 12.2; Platelets 151   Lipid Panel    Component Value Date/Time   CHOL 124 12/02/2019 0257   TRIG 46 12/02/2019 0257   HDL 54 12/02/2019 0257   CHOLHDL 2.3 12/02/2019 0257   VLDL 9 12/02/2019 0257   LDLCALC 61 12/02/2019 0257    Additional studies/ records that were reviewed today include:   Echocardiogram: 11/2019 IMPRESSIONS    1. Left ventricular ejection fraction, by estimation, is 40 to 45%. The  left ventricle has mildly decreased function. The left ventricle  demonstrates regional wall motion abnormalities. Basal to mid  inferolateral and basal to mid inferior akinesis.  There is mild focal basal septal hypertrophy left ventricular hypertrophy  with mitral valve systolic anterior motion. No LV outflow gradient. Left  ventricular diastolic parameters are consistent with Grade I diastolic  dysfunction (impaired relaxation).  2. Right ventricular systolic function is normal. The right ventricular  size is normal. There is normal pulmonary artery systolic pressure. The  estimated right ventricular systolic pressure is 35.7 mmHg.  3. Left atrial size was mildly dilated.  4. The mitral valve is abnormal with mitral valve systolic anterior  motion noted. Mild mitral valve regurgitation. No evidence of mitral  stenosis.  5. The aortic valve is tricuspid. Aortic valve regurgitation is not  visualized. Mild aortic valve sclerosis is present, with no evidence of  aortic valve stenosis.  6. The inferior vena cava is normal in size with greater than 50%  respiratory variability, suggesting right atrial pressure of 3 mmHg.  7. Technically difficult study with poor acoustic windows.   Cardiac Catheterization: 11/2019 Conclusions: 1. Severe native coronary  artery disease, including 90% proximal LAD stenosis, chronic total occlusion of the proximal LCx, and sequential 90% and 100% lesions involving non-dominant RCA. 2. Widely patent LIMA-LAD. 3. Patent SVG-D1 with mild disease (~20%). 4. Heavily diseased SVG-OM1 with moderate diffuse proximal and mid graft disease and sequential 99% and 100% stenoses involving the distal graft and anastomosis to  OM1.  This is likely the culprit vessel for the patient's NSTEMI. 5. Chronically occluded SVG-LPL. 6. Normal left ventricular filling pressure.  Recommendations: 1. Aggressive medical therapy, including 48 hours of IV heparin and 12 months of dual antiplatelet therapy with aspirin and clopidogrel.  I do not believe that PCI to SVG-OM1 would provide much benefit, as Ricardo Castillo is asymptomatic at this time on low-dose IV nitroglycerin and onset of symptoms was at least 2 days ago.  Also, the graft is diffusely diseased and supplies a relatively small branch that fills by left-to-left collaterals.  PCI to SVG-OM1 could introduce significant microvascular dysfunction/no-flow through embolization of thrombus and atheroma in the almost 82 year old graft. 2. Secondary prevention, including high-intensity statin therapy. 3. Obtain transthoracic echocardiogram.   Assessment:    1. Atherosclerosis of coronary artery bypass graft of native heart without angina pectoris   2. Ischemic cardiomyopathy   3. Hyperlipidemia LDL goal <70   4. AAA (abdominal aortic aneurysm) without rupture ([place])      Plan:   In order of problems listed above:  1. CAD - he is s/p CABG in 2002 and recently had an NSTEMI last month with catheterization showing severe native vessel CAD with patent LIMA-LAD, patent SVG-D1, chronically occluded SVG-PDA and diseased SVG-OM1 with moderate diffuse proximal and mid graft disease and sequential 99% and 100% stenoses involving the distal graft and anastomosis to OM1. Medical therapy was recommended.  Reviewed cath report in detail with the patient today.  - He denies any recurrent anginal symptoms. - Continue current medical therapy with ASA 81 mg daily, Plavix 75 mg daily, Atorvastatin 80 mg daily and Toprol-XL 25 mg daily.   2. Ischemic Cardiomyopathy - EF was reduced at 40 to 45% by echocardiogram during his recent admission. He denies any specific orthopnea, PND or lower extremity edema. Appears euvolemic by examination today. Will continue Toprol-XL 25 mg daily. Given his episodes of hypotension and continued positional dizziness, I am concerned about orthostasis and am hesitant to add an ARB at this time. Will continue to follow BP and can reassess at the time of his next visit.  3. HLD - He was transitioned from Simvastatin to Atorvastatin 80 mg daily during his recent admission in the setting of ACS. LDL was 61. Will plan for repeat FLP and LFT's in 6-8 weeks.   4. AAA - This measured 3.9 cm by ultrasound in 06/2018. He will be due for repeat imaging later this year.  Medication Adjustments/Labs and Tests Ordered: Current medicines are reviewed at length with the patient today.  Concerns regarding medicines are outlined above.  Medication changes, Labs and Tests ordered today are listed in the Patient Instructions below. Patient Instructions  Medication Instructions:   No changes.   Labwork:  Lipid Panel and Liver Function Tests in 6-8 weeks.   Testing/Procedures:  None  Follow-Up:  In 5-6 months with Bernerd Pho, PA-C or Dr. Domenic Polite  Any Other Special Instructions Will Be Listed Below (If Applicable).     If you need a refill on your cardiac medications before your next appointment, please call your pharmacy.      Signed, Erma Heritage, PA-C  12/18/2019 5:37 PM    [facility] [address] [place], [place] [address] Phone: [phone] Fax: [phone]

## 2019-12-26 ENCOUNTER — Other Ambulatory Visit: Payer: Self-pay

## 2019-12-30 DIAGNOSIS — E782 Mixed hyperlipidemia: Secondary | ICD-10-CM | POA: Diagnosis not present

## 2019-12-30 DIAGNOSIS — E7849 Other hyperlipidemia: Secondary | ICD-10-CM | POA: Diagnosis not present

## 2019-12-30 DIAGNOSIS — D696 Thrombocytopenia, unspecified: Secondary | ICD-10-CM | POA: Diagnosis not present

## 2019-12-30 DIAGNOSIS — I2510 Atherosclerotic heart disease of native coronary artery without angina pectoris: Secondary | ICD-10-CM | POA: Diagnosis not present

## 2019-12-30 DIAGNOSIS — Z0000 Encounter for general adult medical examination without abnormal findings: Secondary | ICD-10-CM | POA: Diagnosis not present

## 2019-12-30 DIAGNOSIS — I10 Essential (primary) hypertension: Secondary | ICD-10-CM | POA: Diagnosis not present

## 2019-12-30 DIAGNOSIS — N400 Enlarged prostate without lower urinary tract symptoms: Secondary | ICD-10-CM | POA: Diagnosis not present

## 2019-12-30 DIAGNOSIS — Z23 Encounter for immunization: Secondary | ICD-10-CM | POA: Diagnosis not present

## 2019-12-30 DIAGNOSIS — I714 Abdominal aortic aneurysm, without rupture: Secondary | ICD-10-CM | POA: Diagnosis not present

## 2020-01-05 DIAGNOSIS — I2510 Atherosclerotic heart disease of native coronary artery without angina pectoris: Secondary | ICD-10-CM | POA: Diagnosis not present

## 2020-01-05 DIAGNOSIS — I255 Ischemic cardiomyopathy: Secondary | ICD-10-CM | POA: Diagnosis not present

## 2020-01-05 DIAGNOSIS — Z0001 Encounter for general adult medical examination with abnormal findings: Secondary | ICD-10-CM | POA: Diagnosis not present

## 2020-01-05 DIAGNOSIS — E782 Mixed hyperlipidemia: Secondary | ICD-10-CM | POA: Diagnosis not present

## 2020-01-05 DIAGNOSIS — I714 Abdominal aortic aneurysm, without rupture: Secondary | ICD-10-CM | POA: Diagnosis not present

## 2020-01-05 DIAGNOSIS — I10 Essential (primary) hypertension: Secondary | ICD-10-CM | POA: Diagnosis not present

## 2020-01-05 DIAGNOSIS — N400 Enlarged prostate without lower urinary tract symptoms: Secondary | ICD-10-CM | POA: Diagnosis not present

## 2020-01-05 DIAGNOSIS — D696 Thrombocytopenia, unspecified: Secondary | ICD-10-CM | POA: Diagnosis not present

## 2020-01-13 DIAGNOSIS — N400 Enlarged prostate without lower urinary tract symptoms: Secondary | ICD-10-CM | POA: Diagnosis not present

## 2020-01-13 DIAGNOSIS — I10 Essential (primary) hypertension: Secondary | ICD-10-CM | POA: Diagnosis not present

## 2020-01-13 DIAGNOSIS — E7849 Other hyperlipidemia: Secondary | ICD-10-CM | POA: Diagnosis not present

## 2020-04-21 DIAGNOSIS — E7849 Other hyperlipidemia: Secondary | ICD-10-CM | POA: Diagnosis not present

## 2020-04-21 DIAGNOSIS — N400 Enlarged prostate without lower urinary tract symptoms: Secondary | ICD-10-CM | POA: Diagnosis not present

## 2020-04-21 DIAGNOSIS — I10 Essential (primary) hypertension: Secondary | ICD-10-CM | POA: Diagnosis not present

## 2020-05-19 NOTE — Progress Notes (Signed)
Cardiology Office Note  Date: 05/20/2020   ID: Ricardo Castillo, DOB Aug 29, 1935, MRN [id number]  PCP:  Benita Stabile, MD  Cardiologist:  Nona Dell, MD Electrophysiologist:  None   Chief Complaint  Patient presents with  . Cardiac follow-up    History of Present Illness: Ricardo Castillo is an 84 y.o. male last seen in June by Ms. Strader PA-C.  He presents for a routine visit.  Reports no active angina at this time on medical therapy, functional with ADLs including some outside work.  Cardiac catheterization from May is outlined below.  He was found to have patent LIMA to LAD, patent SVG to first diagonal, chronically occluded SVG to PDA, and significantly diseased SVG to first obtuse marginal with both graft and anastomotic disease that was felt to be best managed medically.  I reviewed his medications which are outlined below and stable from a cardiac perspective.  He has been having some recurrent reflux symptoms, using Tums regularly.  We discussed starting Protonix since he is on dual antiplatelet therapy.  No obvious changes in stool or bleeding.  He is due for follow-up abdominal ultrasound, history of 3.9 cm AAA in December 2019.  Past Medical History:  Diagnosis Date  . Abdominal aortic aneurysm (HCC)   . COPD (chronic obstructive pulmonary disease) (HCC)   . Coronary atherosclerosis of native coronary artery    a. s/p CABG in 2002 b. cath in 11/2019 showing patent LIMA-LAD, patent SVG-D1, CTO of SVG-PDA and stenosis along SVG-OM1 wih collaterals noted and med management recommended.   . Essential hypertension   . Hyperlipidemia   . Mixed hyperlipidemia     Past Surgical History:  Procedure Laterality Date  . CATARACT EXTRACTION W/PHACO Right 05/18/2014   Procedure: CATARACT EXTRACTION PHACO AND INTRAOCULAR LENS PLACEMENT (IOC);  Surgeon: Gemma Payor, MD;  Location: [facility];  Service: Ophthalmology;  Laterality: Right;  CDE 19.59  . CATARACT EXTRACTION W/PHACO Left  06/01/2014   Procedure: CATARACT EXTRACTION PHACO AND INTRAOCULAR LENS PLACEMENT LEFT EYE;  Surgeon: Gemma Payor, MD;  Location: [facility];  Service: Ophthalmology;  Laterality: Left;  CDE 19.42  . CORONARY ARTERY BYPASS GRAFT  11/09/00   LIMA to LAD, SVG OM and PLB  . HERNIA REPAIR  1969  . LEFT HEART CATH AND CORS/GRAFTS ANGIOGRAPHY N/A 12/01/2019   Procedure: LEFT HEART CATH AND CORS/GRAFTS ANGIOGRAPHY;  Surgeon: Yvonne Kendall, MD;  [facility];  Service: Cardiovascular;  Laterality: N/A;  . TONSILLECTOMY  Age 52    Current Outpatient Medications  Medication Sig Dispense Refill  . aspirin 81 MG tablet Take 81 mg by mouth daily.    Marland Kitchen atorvastatin (LIPITOR) 80 MG tablet Take 1 tablet (80 mg total) by mouth daily. 30 tablet 11  . clopidogrel (PLAVIX) 75 MG tablet Take 1 tablet (75 mg total) by mouth daily with breakfast. 30 tablet 11  . finasteride (PROSCAR) 5 MG tablet Take 5 mg by mouth daily.    . metoprolol succinate (TOPROL-XL) 25 MG 24 hr tablet Take 1 tablet (25 mg total) by mouth daily. 30 tablet 1  . nitroGLYCERIN (NITROSTAT) 0.4 MG SL tablet Place 1 tablet (0.4 mg total) under the tongue every 5 (five) minutes as needed for chest pain. 25 tablet 1  . terazosin (HYTRIN) 5 MG capsule Take 5 mg by mouth at bedtime.    . pantoprazole (PROTONIX) 20 MG tablet Take 1 tablet (20 mg total) by mouth daily. 90 tablet 3  No current facility-administered medications for this visit.   Allergies:  Other   ROS: No palpitations or syncope.  Physical Exam: VS:  BP (!) 122/58   Pulse 64   Ht 5\' 9"  (1.753 m)   Wt 168 lb (76.2 kg)   SpO2 94%   BMI 24.81 kg/m , BMI Body mass index is 24.81 kg/m.  Wt Readings from Last 3 Encounters:  05/20/20 168 lb (76.2 kg)  12/18/19 165 lb 12.8 oz (75.2 kg)  12/03/19 160 lb 6.4 oz (72.8 kg)    General: Patient appears comfortable at rest. HEENT: Conjunctiva and lids normal, wearing a mask. Neck: Supple, no elevated JVP or carotid  bruits, no thyromegaly. Lungs: Clear to auscultation, nonlabored breathing at rest. Cardiac: Regular rate and rhythm, no S3, soft systolic murmur, no pericardial rub. Extremities: No pitting edema.  ECG:  An ECG dated 12/03/2019 was personally reviewed today and demonstrated:  Sinus rhythm with PACs, low voltage, old anterior infarct pattern, nonspecific T wave changes.  Recent Labwork: 12/02/2019: BUN 10; Creatinine, Ser 0.99; Potassium 3.8; Sodium 139 12/03/2019: Hemoglobin 12.2; Platelets 151     Component Value Date/Time   CHOL 124 12/02/2019 0257   TRIG 46 12/02/2019 0257   HDL 54 12/02/2019 0257   CHOLHDL 2.3 12/02/2019 0257   VLDL 9 12/02/2019 0257   LDLCALC 61 12/02/2019 0257    Other Studies Reviewed Today:  Cardiac catheterization 12/01/2019: Conclusions: 1. Severe native coronary artery disease, including 90% proximal LAD stenosis, chronic total occlusion of the proximal LCx, and sequential 90% and 100% lesions involving non-dominant RCA. 2. Widely patent LIMA-LAD. 3. Patent SVG-D1 with mild disease (~20%). 4. Heavily diseased SVG-OM1 with moderate diffuse proximal and mid graft disease and sequential 99% and 100% stenoses involving the distal graft and anastomosis to OM1.  This is likely the culprit vessel for the patient's NSTEMI. 5. Chronically occluded SVG-LPL. 6. Normal left ventricular filling pressure.  Recommendations: 1. Aggressive medical therapy, including 48 hours of IV heparin and 12 months of dual antiplatelet therapy with aspirin and clopidogrel.  I do not believe that PCI to SVG-OM1 would provide much benefit, as Ricardo Castillo is asymptomatic at this time on low-dose IV nitroglycerin and onset of symptoms was at least 2 days ago.  Also, the graft is diffusely diseased and supplies a relatively small branch that fills by left-to-left collaterals.  PCI to SVG-OM1 could introduce significant microvascular dysfunction/no-flow through embolization of thrombus and  atheroma in the almost 82 year old graft. 2. Secondary prevention, including high-intensity statin therapy. 3. Obtain transthoracic echocardiogram.  Echocardiogram 12/02/2019: 1. Left ventricular ejection fraction, by estimation, is 40 to 45%. The  left ventricle has mildly decreased function. The left ventricle  demonstrates regional wall motion abnormalities. Basal to mid  inferolateral and basal to mid inferior akinesis.  There is mild focal basal septal hypertrophy left ventricular hypertrophy  with mitral valve systolic anterior motion. No LV outflow gradient. Left  ventricular diastolic parameters are consistent with Grade I diastolic  dysfunction (impaired relaxation).  2. Right ventricular systolic function is normal. The right ventricular  size is normal. There is normal pulmonary artery systolic pressure. The  estimated right ventricular systolic pressure is 35.7 mmHg.  3. Left atrial size was mildly dilated.  4. The mitral valve is abnormal with mitral valve systolic anterior  motion noted. Mild mitral valve regurgitation. No evidence of mitral  stenosis.  5. The aortic valve is tricuspid. Aortic valve regurgitation is not  visualized. Mild aortic  valve sclerosis is present, with no evidence of  aortic valve stenosis.  6. The inferior vena cava is normal in size with greater than 50%  respiratory variability, suggesting right atrial pressure of 3 mmHg.  7. Technically difficult study with poor acoustic windows.   Assessment and Plan:  1.  CAD status post CABG in 2002 with graft disease documented at cardiac catheterization in May of this year, managed medically.  He reports no active angina at this time on current regimen.  Continue aspirin, Plavix, Lipitor, Toprol-XL, and as needed nitroglycerin.  We are adding Protonix given reflux symptoms on dual antiplatelet therapy.  2.  Asymptomatic, abdominal aortic aneurysm.  He is due for a follow-up abdominal ultrasound,  last measurement was 3.9 cm in December 2019.  3.  Mixed hyperlipidemia, continues on Lipitor.  Last LDL 61.  Medication Adjustments/Labs and Tests Ordered: Current medicines are reviewed at length with the patient today.  Concerns regarding medicines are outlined above.   Tests Ordered: Orders Placed This Encounter  Procedures  . EKG 12-Lead  . VAS [place] AAA DUPLEX    Medication Changes: Meds ordered this encounter  Medications  . pantoprazole (PROTONIX) 20 MG tablet    Sig: Take 1 tablet (20 mg total) by mouth daily.    Dispense:  90 tablet    Refill:  3    Disposition:  Follow up 6 months in the [place] office.  Signed, Jonelle Sidle, MD, [facility] 05/20/2020 2:27 PM    [facility] [address], [place], [place] [address] Phone: [phone]; Fax: [phone]

## 2020-05-20 ENCOUNTER — Ambulatory Visit: Payer: Medicare HMO | Admitting: Cardiology

## 2020-05-20 ENCOUNTER — Encounter: Payer: Self-pay | Admitting: Cardiology

## 2020-05-20 ENCOUNTER — Other Ambulatory Visit: Payer: Self-pay

## 2020-05-20 VITALS — BP 122/58 | HR 64 | Ht 69.0 in | Wt 168.0 lb

## 2020-05-20 DIAGNOSIS — I714 Abdominal aortic aneurysm, without rupture, unspecified: Secondary | ICD-10-CM

## 2020-05-20 DIAGNOSIS — I255 Ischemic cardiomyopathy: Secondary | ICD-10-CM

## 2020-05-20 DIAGNOSIS — I25119 Atherosclerotic heart disease of native coronary artery with unspecified angina pectoris: Secondary | ICD-10-CM

## 2020-05-20 MED ORDER — PANTOPRAZOLE SODIUM 20 MG PO TBEC
20.0000 mg | DELAYED_RELEASE_TABLET | Freq: Every day | ORAL | 3 refills | Status: DC
Start: 2020-05-20 — End: 2021-03-23

## 2020-05-20 NOTE — Patient Instructions (Addendum)
Medication Instructions:  START Protonix 20 mg daily  *If you need a refill on your cardiac medications before your next appointment, please call your pharmacy*   Lab Work: None today If you have labs (blood work) drawn today and your tests are completely normal, you will receive your results only by: Marland Kitchen MyChart Message (if you have MyChart) OR . A paper copy in the mail If you have any lab test that is abnormal or we need to change your treatment, we will call you to review the results.   Testing/Procedures: Your physician has requested that you have an abdominal aorta duplex. During this test, an ultrasound is used to evaluate the aorta. Allow 30 minutes for this exam. Do not eat after midnight the day before and avoid carbonated beverages   Follow-Up: At [facility] you and your health needs are our priority.  As part of our continuing mission to provide you with exceptional heart care, we have created designated Provider Care Teams.  These Care Teams include your primary Cardiologist (physician) and Advanced Practice Providers (APPs -  Physician Assistants and Nurse Practitioners) who all work together to provide you with the care you need, when you need it.  We recommend signing up for the patient portal called "MyChart".  Sign up information is provided on this After Visit Summary.  MyChart is used to connect with patients for Virtual Visits (Telemedicine).  Patients are able to view lab/test results, encounter notes, upcoming appointments, etc.  Non-urgent messages can be sent to your provider as well.   To learn more about what you can do with MyChart, go to [email or web address].    Your next appointment:   6 month(s)  The format for your next appointment:   In Person  Provider:   Nona Dell, MD   Other Instructions None     Thank you for choosing [facility] !

## 2020-05-21 DIAGNOSIS — Z23 Encounter for immunization: Secondary | ICD-10-CM | POA: Diagnosis not present

## 2020-06-16 ENCOUNTER — Other Ambulatory Visit: Payer: Medicare HMO

## 2020-06-24 ENCOUNTER — Other Ambulatory Visit: Payer: Self-pay | Admitting: Cardiology

## 2020-06-24 ENCOUNTER — Ambulatory Visit (INDEPENDENT_AMBULATORY_CARE_PROVIDER_SITE_OTHER): Payer: Medicare HMO

## 2020-06-24 DIAGNOSIS — I714 Abdominal aortic aneurysm, without rupture, unspecified: Secondary | ICD-10-CM

## 2020-06-30 DIAGNOSIS — Z23 Encounter for immunization: Secondary | ICD-10-CM | POA: Diagnosis not present

## 2020-06-30 DIAGNOSIS — R7301 Impaired fasting glucose: Secondary | ICD-10-CM | POA: Diagnosis not present

## 2020-06-30 DIAGNOSIS — I255 Ischemic cardiomyopathy: Secondary | ICD-10-CM | POA: Diagnosis not present

## 2020-06-30 DIAGNOSIS — I10 Essential (primary) hypertension: Secondary | ICD-10-CM | POA: Diagnosis not present

## 2020-06-30 DIAGNOSIS — E782 Mixed hyperlipidemia: Secondary | ICD-10-CM | POA: Diagnosis not present

## 2020-06-30 DIAGNOSIS — Z0001 Encounter for general adult medical examination with abnormal findings: Secondary | ICD-10-CM | POA: Diagnosis not present

## 2020-06-30 DIAGNOSIS — I2510 Atherosclerotic heart disease of native coronary artery without angina pectoris: Secondary | ICD-10-CM | POA: Diagnosis not present

## 2020-06-30 DIAGNOSIS — Z712 Person consulting for explanation of examination or test findings: Secondary | ICD-10-CM | POA: Diagnosis not present

## 2020-06-30 DIAGNOSIS — Z0000 Encounter for general adult medical examination without abnormal findings: Secondary | ICD-10-CM | POA: Diagnosis not present

## 2020-06-30 DIAGNOSIS — E7849 Other hyperlipidemia: Secondary | ICD-10-CM | POA: Diagnosis not present

## 2020-07-05 DIAGNOSIS — I10 Essential (primary) hypertension: Secondary | ICD-10-CM | POA: Diagnosis not present

## 2020-07-05 DIAGNOSIS — N400 Enlarged prostate without lower urinary tract symptoms: Secondary | ICD-10-CM | POA: Diagnosis not present

## 2020-07-05 DIAGNOSIS — I2510 Atherosclerotic heart disease of native coronary artery without angina pectoris: Secondary | ICD-10-CM | POA: Diagnosis not present

## 2020-07-05 DIAGNOSIS — E782 Mixed hyperlipidemia: Secondary | ICD-10-CM | POA: Diagnosis not present

## 2020-07-05 DIAGNOSIS — I255 Ischemic cardiomyopathy: Secondary | ICD-10-CM | POA: Diagnosis not present

## 2020-07-05 DIAGNOSIS — I714 Abdominal aortic aneurysm, without rupture: Secondary | ICD-10-CM | POA: Diagnosis not present

## 2020-07-05 DIAGNOSIS — D696 Thrombocytopenia, unspecified: Secondary | ICD-10-CM | POA: Diagnosis not present

## 2020-07-09 DIAGNOSIS — E7849 Other hyperlipidemia: Secondary | ICD-10-CM | POA: Diagnosis not present

## 2020-07-09 DIAGNOSIS — I10 Essential (primary) hypertension: Secondary | ICD-10-CM | POA: Diagnosis not present

## 2020-07-09 DIAGNOSIS — I714 Abdominal aortic aneurysm, without rupture: Secondary | ICD-10-CM | POA: Diagnosis not present

## 2020-07-09 DIAGNOSIS — D696 Thrombocytopenia, unspecified: Secondary | ICD-10-CM | POA: Diagnosis not present

## 2020-07-09 DIAGNOSIS — Z23 Encounter for immunization: Secondary | ICD-10-CM | POA: Diagnosis not present

## 2020-09-06 DIAGNOSIS — I10 Essential (primary) hypertension: Secondary | ICD-10-CM | POA: Diagnosis not present

## 2020-09-06 DIAGNOSIS — I2510 Atherosclerotic heart disease of native coronary artery without angina pectoris: Secondary | ICD-10-CM | POA: Diagnosis not present

## 2020-09-06 DIAGNOSIS — E7849 Other hyperlipidemia: Secondary | ICD-10-CM | POA: Diagnosis not present

## 2020-09-06 DIAGNOSIS — N400 Enlarged prostate without lower urinary tract symptoms: Secondary | ICD-10-CM | POA: Diagnosis not present

## 2020-10-06 DIAGNOSIS — N400 Enlarged prostate without lower urinary tract symptoms: Secondary | ICD-10-CM | POA: Diagnosis not present

## 2020-10-06 DIAGNOSIS — I10 Essential (primary) hypertension: Secondary | ICD-10-CM | POA: Diagnosis not present

## 2020-10-06 DIAGNOSIS — E7849 Other hyperlipidemia: Secondary | ICD-10-CM | POA: Diagnosis not present

## 2020-10-06 DIAGNOSIS — I2510 Atherosclerotic heart disease of native coronary artery without angina pectoris: Secondary | ICD-10-CM | POA: Diagnosis not present

## 2020-11-07 DIAGNOSIS — I10 Essential (primary) hypertension: Secondary | ICD-10-CM | POA: Diagnosis not present

## 2020-11-07 DIAGNOSIS — K219 Gastro-esophageal reflux disease without esophagitis: Secondary | ICD-10-CM | POA: Diagnosis not present

## 2020-12-01 ENCOUNTER — Other Ambulatory Visit: Payer: Self-pay

## 2020-12-01 ENCOUNTER — Ambulatory Visit: Payer: Medicare HMO | Admitting: Cardiology

## 2020-12-01 ENCOUNTER — Encounter: Payer: Self-pay | Admitting: Cardiology

## 2020-12-01 VITALS — BP 120/60 | HR 54 | Ht 69.0 in | Wt 167.0 lb

## 2020-12-01 DIAGNOSIS — E782 Mixed hyperlipidemia: Secondary | ICD-10-CM | POA: Diagnosis not present

## 2020-12-01 DIAGNOSIS — I25119 Atherosclerotic heart disease of native coronary artery with unspecified angina pectoris: Secondary | ICD-10-CM | POA: Diagnosis not present

## 2020-12-01 DIAGNOSIS — I714 Abdominal aortic aneurysm, without rupture, unspecified: Secondary | ICD-10-CM

## 2020-12-01 DIAGNOSIS — J449 Chronic obstructive pulmonary disease, unspecified: Secondary | ICD-10-CM | POA: Diagnosis not present

## 2020-12-01 MED ORDER — METOPROLOL SUCCINATE ER 25 MG PO TB24
12.5000 mg | ORAL_TABLET | Freq: Every day | ORAL | 3 refills | Status: DC
Start: 2020-12-01 — End: 2023-05-23

## 2020-12-01 MED ORDER — ISOSORBIDE MONONITRATE ER 30 MG PO TB24
ORAL_TABLET | ORAL | 3 refills | Status: DC
Start: 2020-12-01 — End: 2021-03-09

## 2020-12-01 NOTE — Patient Instructions (Addendum)
Medication Instructions:  Your physician has recommended you make the following change in your medication:  START Imdur 15 mg tablets once daily, in two weeks, increase Imdur to 30 mg once daily DECREASE Metoprolol to 12.5 mg daily  *If you need a refill on your cardiac medications before your next appointment, please call your pharmacy*   Lab Work: None If you have labs (blood work) drawn today and your tests are completely normal, you will receive your results only by: Marland Kitchen MyChart Message (if you have MyChart) OR . A paper copy in the mail If you have any lab test that is abnormal or we need to change your treatment, we will call you to review the results.   Testing/Procedures: None   Follow-Up: At [facility], you and your health needs are our priority.  As part of our continuing mission to provide you with exceptional heart care, we have created designated Provider Care Teams.  These Care Teams include your primary Cardiologist (physician) and Advanced Practice Providers (APPs -  Physician Assistants and Nurse Practitioners) who all work together to provide you with the care you need, when you need it.  We recommend signing up for the patient portal called "MyChart".  Sign up information is provided on this After Visit Summary.  MyChart is used to connect with patients for Virtual Visits (Telemedicine).  Patients are able to view lab/test results, encounter notes, upcoming appointments, etc.  Non-urgent messages can be sent to your provider as well.   To learn more about what you can do with MyChart, go to [email or web address].    Your next appointment:   6 month  The format for your next appointment:   In Person  Provider:   Nona Dell, MD   Other Instructions

## 2020-12-01 NOTE — Progress Notes (Signed)
Cardiology Office Note  Date: 12/01/2020   ID: Huckleberry Martinson Vides, DOB [birth year], MRN [id number]  PCP:  Benita Stabile, MD  Cardiologist:  Nona Dell, MD Electrophysiologist:  None   Chief Complaint  Patient presents with  . Cardiac follow-up    History of Present Illness: Ricardo Castillo is an 85 y.o. male last seen in November 2021.  He is here for a routine visit.  He reports exertional shortness of breath and fatigue, no chest tightness or nitroglycerin use.  He states that he has been taking his medications regularly.  He did have a follow-up abdominal ultrasound in December 2021 that revealed stable AAA at 3.9 cm.  He is asymptomatic.  We discussed adjustments in his medical therapy.  Past Medical History:  Diagnosis Date  . Abdominal aortic aneurysm (HCC)   . COPD (chronic obstructive pulmonary disease) (HCC)   . Coronary atherosclerosis of native coronary artery    a. s/p CABG in 2002 b. cath in 11/2019 showing patent LIMA-LAD, patent SVG-D1, CTO of SVG-PDA and stenosis along SVG-OM1 wih collaterals noted and med management recommended.   . Essential hypertension   . Hyperlipidemia   . Mixed hyperlipidemia     Past Surgical History:  Procedure Laterality Date  . CATARACT EXTRACTION W/PHACO Right 05/18/2014   Procedure: CATARACT EXTRACTION PHACO AND INTRAOCULAR LENS PLACEMENT (IOC);  Surgeon: Gemma Payor, MD;  Location: [facility];  Service: Ophthalmology;  Laterality: Right;  CDE 19.59  . CATARACT EXTRACTION W/PHACO Left 06/01/2014   Procedure: CATARACT EXTRACTION PHACO AND INTRAOCULAR LENS PLACEMENT LEFT EYE;  Surgeon: Gemma Payor, MD;  Location: [facility];  Service: Ophthalmology;  Laterality: Left;  CDE 19.42  . CORONARY ARTERY BYPASS GRAFT  11/09/00   LIMA to LAD, SVG OM and PLB  . HERNIA REPAIR  1969  . LEFT HEART CATH AND CORS/GRAFTS ANGIOGRAPHY N/A 12/01/2019   Procedure: LEFT HEART CATH AND CORS/GRAFTS ANGIOGRAPHY;  Surgeon: Yvonne Kendall, MD;  [facility];  Service: Cardiovascular;  Laterality: N/A;  . TONSILLECTOMY  Age 79    Current Outpatient Medications  Medication Sig Dispense Refill  . aspirin 81 MG tablet Take 81 mg by mouth daily.    Marland Kitchen atorvastatin (LIPITOR) 80 MG tablet Take 1 tablet (80 mg total) by mouth daily. 30 tablet 11  . clopidogrel (PLAVIX) 75 MG tablet Take 1 tablet (75 mg total) by mouth daily with breakfast. 30 tablet 11  . finasteride (PROSCAR) 5 MG tablet Take 5 mg by mouth daily.    . metoprolol succinate (TOPROL-XL) 25 MG 24 hr tablet Take 1 tablet (25 mg total) by mouth daily. 30 tablet 1  . nitroGLYCERIN (NITROSTAT) 0.4 MG SL tablet Place 1 tablet (0.4 mg total) under the tongue every 5 (five) minutes as needed for chest pain. 25 tablet 1  . pantoprazole (PROTONIX) 20 MG tablet Take 1 tablet (20 mg total) by mouth daily. 90 tablet 3  . terazosin (HYTRIN) 5 MG capsule Take 5 mg by mouth at bedtime.     No current facility-administered medications for this visit.   Allergies:  Other   ROS: No palpitations or syncope.  Physical Exam: VS:  BP 120/60   Pulse (!) 54   Ht 5\' 9"  (1.753 m)   Wt 167 lb (75.8 kg)   SpO2 98%   BMI 24.66 kg/m , BMI Body mass index is 24.66 kg/m.  Wt Readings from Last 3 Encounters:  12/01/20 167 lb (75.8 kg)  05/20/20 168 lb (76.2 kg)  12/18/19 165 lb 12.8 oz (75.2 kg)    General: Patient appears comfortable at rest. HEENT: Conjunctiva and lids normal, wearing a mask. Neck: Supple, no elevated JVP or carotid bruits, no thyromegaly. Lungs: Clear to auscultation, nonlabored breathing at rest. Cardiac: Regular rate and rhythm, no S3, 1/6 systolic murmur, no pericardial rub. Extremities: No pitting edema.  ECG:  An ECG dated 05/20/2020 was personally reviewed today and demonstrated:  Sinus rhythm with probable old high lateral infarct pattern and PVCs.  Recent Labwork: 12/03/2019: Hemoglobin 12.2; Platelets 151     Component Value Date/Time   CHOL 124 12/02/2019 0257    TRIG 46 12/02/2019 0257   HDL 54 12/02/2019 0257   CHOLHDL 2.3 12/02/2019 0257   VLDL 9 12/02/2019 0257   LDLCALC 61 12/02/2019 0257  December 2021: Hemoglobin 13.1, platelets 160, BUN 16, creatinine 1.06, potassium 4.6, AST 16, ALT 8, cholesterol 125, triglycerides 102, HDL 40, LDL 66, hemoglobin A1c 5.8%  Other Studies Reviewed Today:  Cardiac catheterization 12/01/2019: Conclusions: 1. Severe native coronary artery disease, including 90% proximal LAD stenosis, chronic total occlusion of the proximal LCx, and sequential 90% and 100% lesions involving non-dominant RCA. 2. Widely patent LIMA-LAD. 3. Patent SVG-D1 with mild disease (~20%). 4. Heavily diseased SVG-OM1 with moderate diffuse proximal and mid graft disease and sequential 99% and 100% stenoses involving the distal graft and anastomosis to OM1. This is likely the culprit vessel for the patient's NSTEMI. 5. Chronically occluded SVG-LPL. 6. Normal left ventricular filling pressure.  Recommendations: 1. Aggressive medical therapy, including 48 hours of IV heparin and 12 months of dual antiplatelet therapy with aspirin and clopidogrel. I do not believe that PCI to SVG-OM1 would provide much benefit, as Mr. Southers is asymptomatic at this time on low-dose IV nitroglycerin and onset of symptoms was at least 2 days ago. Also, the graft is diffusely diseased and supplies a relatively small branch that fills by left-to-left collaterals. PCI to SVG-OM1 could introduce significant microvascular dysfunction/no-flow through embolization of thrombus and atheroma in the almost 83 year old graft. 2. Secondary prevention, including high-intensity statin therapy. 3. Obtain transthoracic echocardiogram.  Echocardiogram 12/02/2019: 1. Left ventricular ejection fraction, by estimation, is 40 to 45%. The  left ventricle has mildly decreased function. The left ventricle  demonstrates regional wall motion abnormalities. Basal to mid  inferolateral and  basal to mid inferior akinesis.  There is mild focal basal septal hypertrophy left ventricular hypertrophy  with mitral valve systolic anterior motion. No LV outflow gradient. Left  ventricular diastolic parameters are consistent with Grade I diastolic  dysfunction (impaired relaxation).  2. Right ventricular systolic function is normal. The right ventricular  size is normal. There is normal pulmonary artery systolic pressure. The  estimated right ventricular systolic pressure is 35.7 mmHg.  3. Left atrial size was mildly dilated.  4. The mitral valve is abnormal with mitral valve systolic anterior  motion noted. Mild mitral valve regurgitation. No evidence of mitral  stenosis.  5. The aortic valve is tricuspid. Aortic valve regurgitation is not  visualized. Mild aortic valve sclerosis is present, with no evidence of  aortic valve stenosis.  6. The inferior vena cava is normal in size with greater than 50%  respiratory variability, suggesting right atrial pressure of 3 mmHg.  7. Technically difficult study with poor acoustic windows.   Abdominal ultrasound 06/24/2020: Summary:  Abdominal Aorta: There is evidence of abnormal dilatation of the mid and  distal Abdominal aorta. The  largest aortic measurement is 3.9 cm. The  largest aortic diameter remains essentially unchanged compared to prior  exam. Previous diameter measurement was  3.9 cm obtained on 07/09/18.    *See table(s) above for measurements and observations.  Suggest follow up study in 12 months.   Assessment and Plan:  1.  AD status post CABG in 2002 with graft disease managed medically as of cardiac catheterization in May of last year.  Plan to reduce Toprol-XL to 12.5 mg daily and initiate Imdur 15 to 30 mg daily as tolerated.  Otherwise continue aspirin, Plavix, and Lipitor.  2.  Mixed hyperlipidemia, tolerating Lipitor with last LDL 66.  3.  Asymptomatic AAA, stable at 3.9 cm.  Medication Adjustments/Labs  and Tests Ordered: Current medicines are reviewed at length with the patient today.  Concerns regarding medicines are outlined above.   Tests Ordered: No orders of the defined types were placed in this encounter.   Medication Changes: No orders of the defined types were placed in this encounter.   Disposition:  Follow up 6 months.  Signed, Jonelle Sidle, MD, [facility] 12/01/2020 2:50 PM    [facility] at [facility] [address] [place], [place] [address] Phone: [phone]; Fax: [phone]

## 2020-12-22 DIAGNOSIS — I10 Essential (primary) hypertension: Secondary | ICD-10-CM | POA: Diagnosis not present

## 2020-12-27 DIAGNOSIS — D696 Thrombocytopenia, unspecified: Secondary | ICD-10-CM | POA: Diagnosis not present

## 2020-12-27 DIAGNOSIS — I714 Abdominal aortic aneurysm, without rupture: Secondary | ICD-10-CM | POA: Diagnosis not present

## 2020-12-27 DIAGNOSIS — I2510 Atherosclerotic heart disease of native coronary artery without angina pectoris: Secondary | ICD-10-CM | POA: Diagnosis not present

## 2020-12-27 DIAGNOSIS — K219 Gastro-esophageal reflux disease without esophagitis: Secondary | ICD-10-CM | POA: Diagnosis not present

## 2020-12-27 DIAGNOSIS — N400 Enlarged prostate without lower urinary tract symptoms: Secondary | ICD-10-CM | POA: Diagnosis not present

## 2020-12-27 DIAGNOSIS — I255 Ischemic cardiomyopathy: Secondary | ICD-10-CM | POA: Diagnosis not present

## 2020-12-27 DIAGNOSIS — E785 Hyperlipidemia, unspecified: Secondary | ICD-10-CM | POA: Diagnosis not present

## 2020-12-27 DIAGNOSIS — I10 Essential (primary) hypertension: Secondary | ICD-10-CM | POA: Diagnosis not present

## 2020-12-27 DIAGNOSIS — R7303 Prediabetes: Secondary | ICD-10-CM | POA: Diagnosis not present

## 2021-03-09 ENCOUNTER — Telehealth: Payer: Self-pay | Admitting: Cardiology

## 2021-03-09 MED ORDER — ISOSORBIDE MONONITRATE ER 30 MG PO TB24
30.0000 mg | ORAL_TABLET | Freq: Every day | ORAL | 3 refills | Status: DC
Start: 2021-03-09 — End: 2022-04-26

## 2021-03-09 NOTE — Telephone Encounter (Signed)
Refilled to [facility] Imdur 30 mg qd, #90

## 2021-03-09 NOTE — Telephone Encounter (Signed)
Please send refill on isosorbide mononitrate (IMDUR) 30 MG 24 hr tablet [[id number]] to [facility] on [address] a 90 day supply   Normally gets it through [employer] order but he's not been able to get it

## 2021-03-23 ENCOUNTER — Other Ambulatory Visit: Payer: Self-pay | Admitting: Cardiology

## 2021-04-08 DIAGNOSIS — I10 Essential (primary) hypertension: Secondary | ICD-10-CM | POA: Diagnosis not present

## 2021-04-08 DIAGNOSIS — K219 Gastro-esophageal reflux disease without esophagitis: Secondary | ICD-10-CM | POA: Diagnosis not present

## 2021-04-20 DIAGNOSIS — Z23 Encounter for immunization: Secondary | ICD-10-CM | POA: Diagnosis not present

## 2021-06-06 NOTE — Progress Notes (Signed)
Cardiology Office Note  Date: 06/07/2021   ID: Ricardo Castillo, DOB 11-13-1935, MRN [id number]  PCP:  Benita Stabile, MD  Cardiologist:  Nona Dell, MD Electrophysiologist:  None   Chief Complaint  Patient presents with   Cardiac follow-up    History of Present Illness: Ricardo Castillo is an 85 y.o. male last seen in May.  He is here for a routine visit.  Since last encounter he does not report any recurring chest pain on medical therapy.  I reviewed his current medications which are noted below and stable from a cardiac perspective.  No bleeding problems on aspirin and Plavix.  He will have follow-up with Dr. Margo Aye in December.  His last LDL was 66.  I personally reviewed his ECG today which shows sinus bradycardia with nonspecific ST-T changes.  He is due for a follow-up abdominal ultrasound for review of AAA size.  Past Medical History:  Diagnosis Date   Abdominal aortic aneurysm    COPD (chronic obstructive pulmonary disease) (HCC)    Coronary atherosclerosis of native coronary artery    a. s/p CABG in 2002 b. cath in 11/2019 showing patent LIMA-LAD, patent SVG-D1, CTO of SVG-PDA and stenosis along SVG-OM1 wih collaterals noted and med management recommended.    Essential hypertension    Hyperlipidemia    Mixed hyperlipidemia     Past Surgical History:  Procedure Laterality Date   CATARACT EXTRACTION W/PHACO Right 05/18/2014   Procedure: CATARACT EXTRACTION PHACO AND INTRAOCULAR LENS PLACEMENT (IOC);  Surgeon: Gemma Payor, MD;  Location: [facility];  Service: Ophthalmology;  Laterality: Right;  CDE 19.59   CATARACT EXTRACTION W/PHACO Left 06/01/2014   Procedure: CATARACT EXTRACTION PHACO AND INTRAOCULAR LENS PLACEMENT LEFT EYE;  Surgeon: Gemma Payor, MD;  Location: [facility];  Service: Ophthalmology;  Laterality: Left;  CDE 19.42   CORONARY ARTERY BYPASS GRAFT  11/09/00   LIMA to LAD, SVG OM and PLB   HERNIA REPAIR  1969   LEFT HEART CATH AND CORS/GRAFTS ANGIOGRAPHY N/A  12/01/2019   Procedure: LEFT HEART CATH AND CORS/GRAFTS ANGIOGRAPHY;  Surgeon: Yvonne Kendall, MD;  [facility]: [facility];  Service: Cardiovascular;  Laterality: N/A;   TONSILLECTOMY  Age 49    Current Outpatient Medications  Medication Sig Dispense Refill   aspirin 81 MG tablet Take 81 mg by mouth daily.     atorvastatin (LIPITOR) 80 MG tablet Take 1 tablet (80 mg total) by mouth daily. 30 tablet 11   clopidogrel (PLAVIX) 75 MG tablet Take 1 tablet (75 mg total) by mouth daily with breakfast. 30 tablet 11   finasteride (PROSCAR) 5 MG tablet Take 5 mg by mouth daily.     isosorbide mononitrate (IMDUR) 30 MG 24 hr tablet Take 1 tablet (30 mg total) by mouth daily. 90 tablet 3   metoprolol succinate (TOPROL XL) 25 MG 24 hr tablet Take 0.5 tablets (12.5 mg total) by mouth daily. 45 tablet 3   nitroGLYCERIN (NITROSTAT) 0.4 MG SL tablet Place 1 tablet (0.4 mg total) under the tongue every 5 (five) minutes as needed for chest pain. 25 tablet 1   pantoprazole (PROTONIX) 20 MG tablet TAKE 1 TABLET EVERY DAY 90 tablet 3   terazosin (HYTRIN) 5 MG capsule Take 5 mg by mouth at bedtime.     No current facility-administered medications for this visit.   Allergies:  Other   ROS: No palpitations or syncope.  Physical Exam: VS:  BP 108/70   Pulse (!) 55  Ht 5\' 9"  (1.753 m)   Wt 163 lb 6.4 oz (74.1 kg)   SpO2 94%   BMI 24.13 kg/m , BMI Body mass index is 24.13 kg/m.  Wt Readings from Last 3 Encounters:  06/07/21 163 lb 6.4 oz (74.1 kg)  12/01/20 167 lb (75.8 kg)  05/20/20 168 lb (76.2 kg)    General: Patient appears comfortable at rest. HEENT: Conjunctiva and lids normal, wearing a mask. Neck: Supple, no elevated JVP or carotid bruits, no thyromegaly. Lungs: Clear to auscultation, nonlabored breathing at rest. Cardiac: Regular rate and rhythm, no S3, 1/6 systolic murmur, no pericardial rub. Extremities: No pitting edema.  ECG:  An ECG dated 05/20/2020 was personally reviewed today  and demonstrated:  Sinus rhythm with probable old high lateral infarct pattern and PVCs.  Recent Labwork:  December 2021: Hemoglobin 13.1, platelets 160, BUN 16, creatinine 1.06, potassium 4.6, AST 16, ALT 8, cholesterol 125, triglycerides 102, HDL 40, LDL 66, hemoglobin A1c 5.8%  Other Studies Reviewed Today:  Echocardiogram 12/02/2019:  1. Left ventricular ejection fraction, by estimation, is 40 to 45%. The  left ventricle has mildly decreased function. The left ventricle  demonstrates regional wall motion abnormalities. Basal to mid  inferolateral and basal to mid inferior akinesis.  There is mild focal basal septal hypertrophy left ventricular hypertrophy  with mitral valve systolic anterior motion. No LV outflow gradient. Left  ventricular diastolic parameters are consistent with Grade I diastolic  dysfunction (impaired relaxation).   2. Right ventricular systolic function is normal. The right ventricular  size is normal. There is normal pulmonary artery systolic pressure. The  estimated right ventricular systolic pressure is 35.7 mmHg.   3. Left atrial size was mildly dilated.   4. The mitral valve is abnormal with mitral valve systolic anterior  motion noted. Mild mitral valve regurgitation. No evidence of mitral  stenosis.   5. The aortic valve is tricuspid. Aortic valve regurgitation is not  visualized. Mild aortic valve sclerosis is present, with no evidence of  aortic valve stenosis.   6. The inferior vena cava is normal in size with greater than 50%  respiratory variability, suggesting right atrial pressure of 3 mmHg.   7. Technically difficult study with poor acoustic windows.    Abdominal ultrasound 06/24/2020: Summary:  Abdominal Aorta: There is evidence of abnormal dilatation of the mid and  distal Abdominal aorta. The largest aortic measurement is 3.9 cm. The  largest aortic diameter remains essentially unchanged compared to prior  exam. Previous diameter  measurement was   3.9 cm obtained on 07/09/18.     *See table(s) above for measurements and observations.  Suggest follow up study in 12 months.   Assessment and Plan:  1.  Multivessel CAD status post CABG in 2002 with graft disease managed medically as of cardiac catheterization in May 2021.  He does not report any active angina on current regimen.  ECG reviewed.  Continue aspirin, Plavix, Lipitor, Toprol-XL, and Imdur.  2.  Mixed hyperlipidemia, on Lipitor.  Last LDL 66 with follow-up lab work per PCP in December of this year.  3.  Asymptomatic abdominal aortic aneurysm, 3.9 cm as of December 2021.  Follow-up study will be obtained.  Medication Adjustments/Labs and Tests Ordered: Current medicines are reviewed at length with the patient today.  Concerns regarding medicines are outlined above.   Tests Ordered: Orders Placed This Encounter  Procedures   January 2022 AORTA   EKG 12-Lead    Medication Changes: No orders of the defined  types were placed in this encounter.   Disposition:  Follow up  6 months.  Signed, Jonelle Sidle, MD, [facility] 06/07/2021 9:41 AM    [facility] [address] [place], [place] [address] Phone: [phone]; Fax: [phone]

## 2021-06-07 ENCOUNTER — Encounter: Payer: Self-pay | Admitting: Cardiology

## 2021-06-07 ENCOUNTER — Other Ambulatory Visit: Payer: Self-pay

## 2021-06-07 ENCOUNTER — Ambulatory Visit: Payer: Medicare HMO | Admitting: Cardiology

## 2021-06-07 VITALS — BP 108/70 | HR 55 | Ht 69.0 in | Wt 163.4 lb

## 2021-06-07 DIAGNOSIS — E782 Mixed hyperlipidemia: Secondary | ICD-10-CM | POA: Diagnosis not present

## 2021-06-07 DIAGNOSIS — I25119 Atherosclerotic heart disease of native coronary artery with unspecified angina pectoris: Secondary | ICD-10-CM | POA: Diagnosis not present

## 2021-06-07 DIAGNOSIS — I7141 Pararenal abdominal aortic aneurysm, without rupture: Secondary | ICD-10-CM | POA: Diagnosis not present

## 2021-06-07 NOTE — Patient Instructions (Signed)
Medication Instructions:  Your physician recommends that you continue on your current medications as directed. Please refer to the Current Medication list given to you today.   Labwork: None today  Testing/Procedures: Your physician has requested that you have an abdominal aorta duplex. During this test, an ultrasound is used to evaluate the aorta. Allow 30 minutes for this exam. Do not eat after midnight the day before and avoid carbonated beverages   Follow-Up: 6 months  Any Other Special Instructions Will Be Listed Below (If Applicable).  If you need a refill on your cardiac medications before your next appointment, please call your pharmacy.

## 2021-06-08 DIAGNOSIS — E782 Mixed hyperlipidemia: Secondary | ICD-10-CM | POA: Diagnosis not present

## 2021-06-08 DIAGNOSIS — I10 Essential (primary) hypertension: Secondary | ICD-10-CM | POA: Diagnosis not present

## 2021-06-15 ENCOUNTER — Other Ambulatory Visit: Payer: Self-pay

## 2021-06-15 ENCOUNTER — Ambulatory Visit (HOSPITAL_COMMUNITY)
Admission: RE | Admit: 2021-06-15 | Discharge: 2021-06-15 | Disposition: A | Discharge status: Home / Self Care | Payer: Medicare HMO | Source: Ambulatory Visit | Attending: Cardiology | Admitting: Cardiology

## 2021-06-15 DIAGNOSIS — I7141 Pararenal abdominal aortic aneurysm, without rupture: Secondary | ICD-10-CM | POA: Insufficient documentation

## 2021-06-15 DIAGNOSIS — I7143 Infrarenal abdominal aortic aneurysm, without rupture: Secondary | ICD-10-CM | POA: Diagnosis not present

## 2021-06-18 IMAGING — DX DG CHEST 1V PORT
1 series · 1 of 1 positions shown · non-contrast
Comparison: None.

CLINICAL DATA: Chest pain

EXAM:
PORTABLE CHEST 1 VIEW

[chest ap]
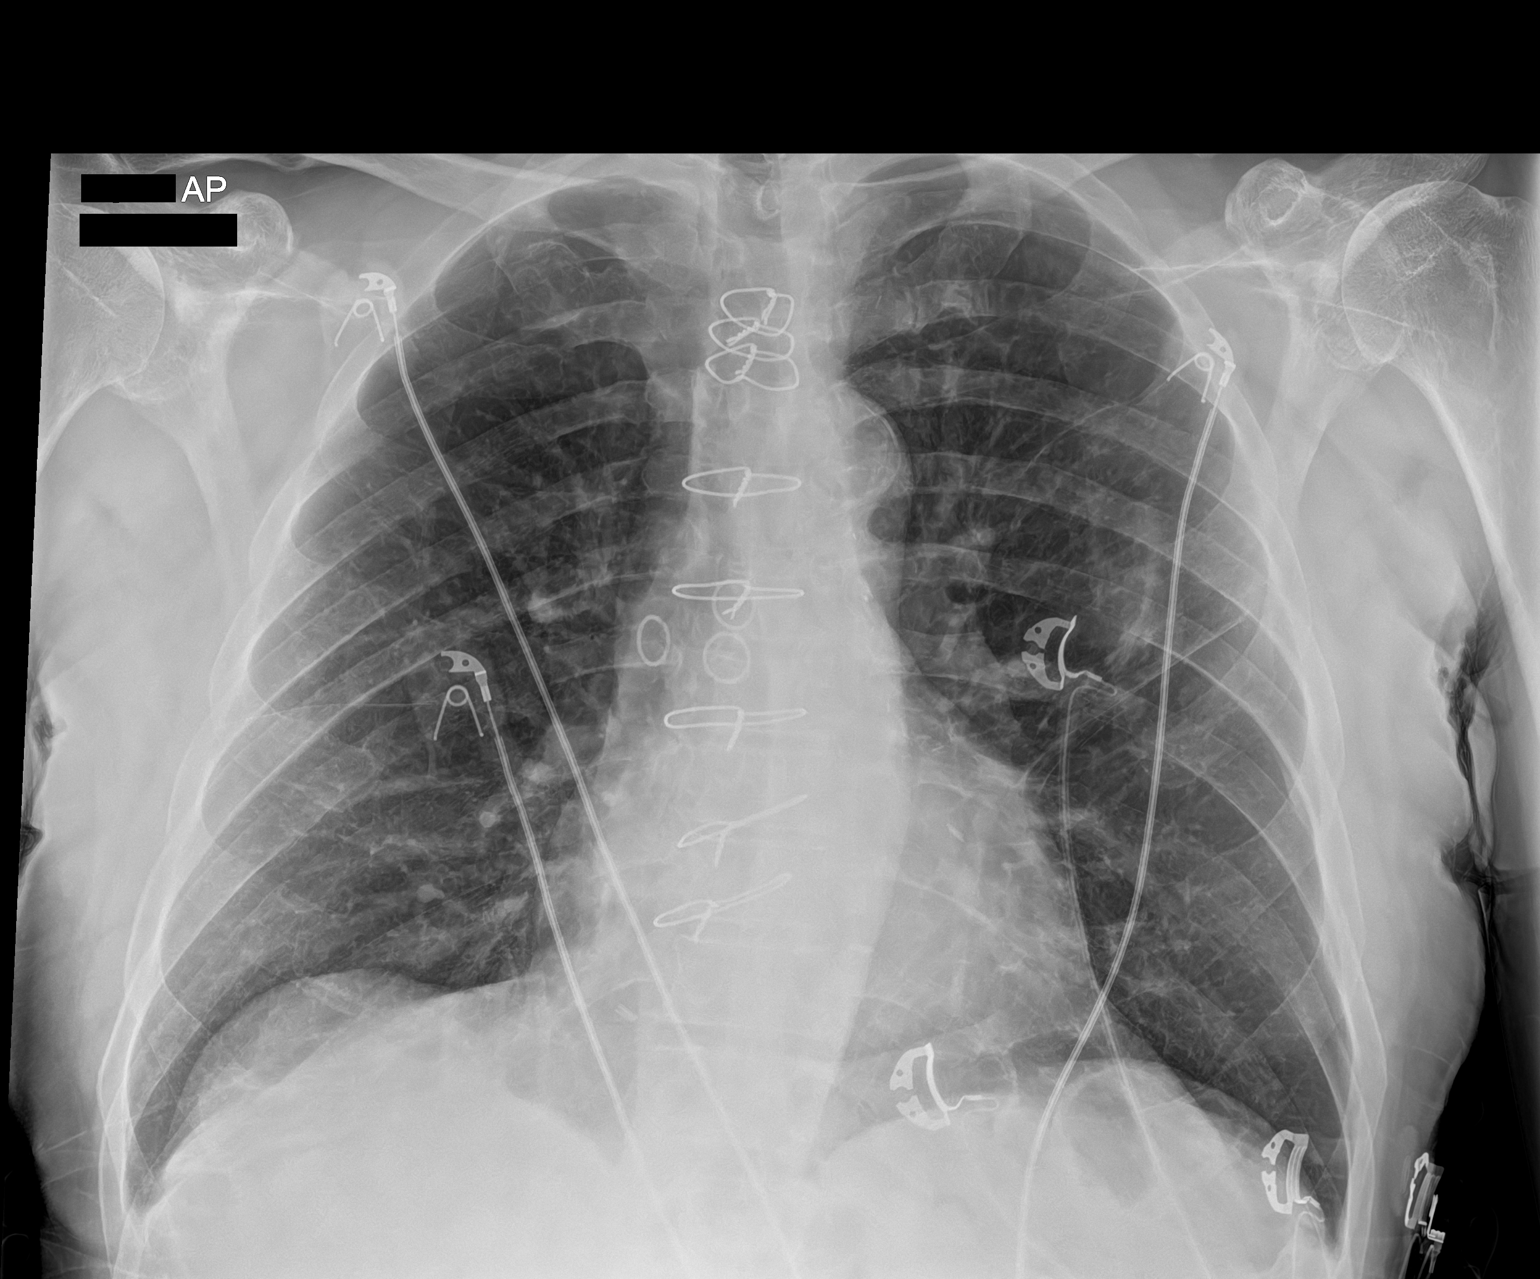

[1 of 1 positions shown; findings below may reference images not displayed]

FINDINGS: There is scarring in the left mid lung. The lungs elsewhere are
clear. Heart size and pulmonary vascularity are normal. No
adenopathy. Patient is status post coronary artery bypass grafting.
There is aortic atherosclerosis. No pneumothorax. No bone lesions.
IMPRESSION: Scarring left mid lung. Lungs elsewhere clear. Heart size normal.
Status post coronary artery bypass grafting. Aortic Atherosclerosis
([id number]-L90.0).

## 2021-06-23 ENCOUNTER — Other Ambulatory Visit: Payer: Medicare HMO

## 2021-06-29 DIAGNOSIS — N400 Enlarged prostate without lower urinary tract symptoms: Secondary | ICD-10-CM | POA: Diagnosis not present

## 2021-06-29 DIAGNOSIS — I10 Essential (primary) hypertension: Secondary | ICD-10-CM | POA: Diagnosis not present

## 2021-06-29 DIAGNOSIS — R7303 Prediabetes: Secondary | ICD-10-CM | POA: Diagnosis not present

## 2021-07-05 DIAGNOSIS — Z0001 Encounter for general adult medical examination with abnormal findings: Secondary | ICD-10-CM | POA: Diagnosis not present

## 2021-07-05 DIAGNOSIS — I714 Abdominal aortic aneurysm, without rupture, unspecified: Secondary | ICD-10-CM | POA: Diagnosis not present

## 2021-07-05 DIAGNOSIS — R7303 Prediabetes: Secondary | ICD-10-CM | POA: Diagnosis not present

## 2021-07-05 DIAGNOSIS — N400 Enlarged prostate without lower urinary tract symptoms: Secondary | ICD-10-CM | POA: Diagnosis not present

## 2021-07-05 DIAGNOSIS — I10 Essential (primary) hypertension: Secondary | ICD-10-CM | POA: Diagnosis not present

## 2021-07-05 DIAGNOSIS — K219 Gastro-esophageal reflux disease without esophagitis: Secondary | ICD-10-CM | POA: Diagnosis not present

## 2021-07-05 DIAGNOSIS — I2510 Atherosclerotic heart disease of native coronary artery without angina pectoris: Secondary | ICD-10-CM | POA: Diagnosis not present

## 2021-07-05 DIAGNOSIS — I255 Ischemic cardiomyopathy: Secondary | ICD-10-CM | POA: Diagnosis not present

## 2021-07-05 DIAGNOSIS — E785 Hyperlipidemia, unspecified: Secondary | ICD-10-CM | POA: Diagnosis not present

## 2021-09-06 DIAGNOSIS — I10 Essential (primary) hypertension: Secondary | ICD-10-CM | POA: Diagnosis not present

## 2021-09-06 DIAGNOSIS — K219 Gastro-esophageal reflux disease without esophagitis: Secondary | ICD-10-CM | POA: Diagnosis not present

## 2021-11-13 NOTE — Progress Notes (Signed)
? ? ?Cardiology Office Note ? ?Date: 11/14/2021  ? ?ID: Ricardo Castillo, DOB 04-Jun-1936, MRN [id number] ? ?PCP:  Benita StabileHall, John Z, MD  ?Cardiologist:  Nona DellSamuel Lillyrose Reitan, MD ?Electrophysiologist:  None  ? ?Chief Complaint  ?Patient presents with  ? Cardiac follow-up  ? ? ?History of Present Illness: ?Ricardo Castillo is an 86 y.o. male last seen in November 2022.  He is here for routine visit.  Reports no angina or nitroglycerin use.  Does feel fatigued and short of breath, NYHA class III.  No orthopnea or PND, no leg swelling.  No palpitations or syncope. ? ?I reviewed his medications.  We discussed getting a follow-up echocardiogram and look for other options as far as GDMT.  He does have relatively low heart rate and blood pressure at baseline which may limit therapy however. ? ?Follow-up abdominal ultrasound in December 2022 showed stable size of infrarenal AAA.  Imaging recommended in 2 years. ? ?Past Medical History:  ?Diagnosis Date  ? Abdominal aortic aneurysm (HCC)   ? COPD (chronic obstructive pulmonary disease) (HCC)   ? Coronary atherosclerosis of native coronary artery   ? a. s/p CABG in 2002 b. cath in 11/2019 showing patent LIMA-LAD, patent SVG-D1, CTO of SVG-PDA and stenosis along SVG-OM1 wih collaterals noted and med management recommended.   ? Essential hypertension   ? Hyperlipidemia   ? Mixed hyperlipidemia   ? ? ?Past Surgical History:  ?Procedure Laterality Date  ? CATARACT EXTRACTION W/PHACO Right 05/18/2014  ? Procedure: CATARACT EXTRACTION PHACO AND INTRAOCULAR LENS PLACEMENT (IOC);  Surgeon: Gemma PayorKerry Hunt, MD;  Location: [facility];  Service: Ophthalmology;  Laterality: Right;  CDE 19.59  ? CATARACT EXTRACTION W/PHACO Left 06/01/2014  ? Procedure: CATARACT EXTRACTION PHACO AND INTRAOCULAR LENS PLACEMENT LEFT EYE;  Surgeon: Gemma PayorKerry Hunt, MD;  Location: [facility];  Service: Ophthalmology;  Laterality: Left;  CDE 19.42  ? CORONARY ARTERY BYPASS GRAFT  11/09/00  ? LIMA to LAD, SVG OM and PLB  ? HERNIA REPAIR  1969  ? LEFT  HEART CATH AND CORS/GRAFTS ANGIOGRAPHY N/A 12/01/2019  ? Procedure: LEFT HEART CATH AND CORS/GRAFTS ANGIOGRAPHY;  Surgeon: Yvonne KendallEnd, Christopher, MD;  Location: [facility];  Service: Cardiovascular;  Laterality: N/A;  ? TONSILLECTOMY  Age 26  ? ? ?Current Outpatient Medications  ?Medication Sig Dispense Refill  ? aspirin 81 MG tablet Take 81 mg by mouth daily.    ? atorvastatin (LIPITOR) 80 MG tablet Take 1 tablet (80 mg total) by mouth daily. 30 tablet 11  ? clopidogrel (PLAVIX) 75 MG tablet Take 1 tablet (75 mg total) by mouth daily with breakfast. 30 tablet 11  ? finasteride (PROSCAR) 5 MG tablet Take 5 mg by mouth daily.    ? isosorbide mononitrate (IMDUR) 30 MG 24 hr tablet Take 1 tablet (30 mg total) by mouth daily. 90 tablet 3  ? metoprolol succinate (TOPROL XL) 25 MG 24 hr tablet Take 0.5 tablets (12.5 mg total) by mouth daily. 45 tablet 3  ? nitroGLYCERIN (NITROSTAT) 0.4 MG SL tablet Place 1 tablet (0.4 mg total) under the tongue every 5 (five) minutes as needed for chest pain. 25 tablet 1  ? pantoprazole (PROTONIX) 20 MG tablet TAKE 1 TABLET EVERY DAY 90 tablet 3  ? terazosin (HYTRIN) 5 MG capsule Take 5 mg by mouth at bedtime.    ? ?No current facility-administered medications for this visit.  ? ?Allergies:  Other  ? ?ROS: No syncope. ? ?Physical Exam: ?VS:  BP 118/60  Pulse 60   Ht 5\' 9"  (1.753 m)   Wt 165 lb 9.6 oz (75.1 kg)   SpO2 95%   BMI 24.45 kg/m? , BMI Body mass index is 24.45 kg/m?. ? ?Wt Readings from Last 3 Encounters:  ?11/14/21 165 lb 9.6 oz (75.1 kg)  ?06/07/21 163 lb 6.4 oz (74.1 kg)  ?12/01/20 167 lb (75.8 kg)  ?  ?General: Patient appears comfortable at rest. ?HEENT: Conjunctiva and lids normal, oropharynx clear. ?Neck: Supple, no elevated JVP or carotid bruits, no thyromegaly. ?Lungs: Decreased breath sounds, nonlabored breathing at rest. ?Cardiac: Regular rate and rhythm, no S3 or significant systolic murmur, no pericardial rub. ?Extremities: No pitting edema. ? ?ECG:  An ECG  dated 06/07/2021 was personally reviewed today and demonstrated:  Sinus bradycardia with nonspecific ST-T changes ? ?Recent Labwork: ?   ?Component Value Date/Time  ? CHOL 124 12/02/2019 0257  ? TRIG 46 12/02/2019 0257  ? HDL 54 12/02/2019 0257  ? CHOLHDL 2.3 12/02/2019 0257  ? VLDL 9 12/02/2019 0257  ? LDLCALC 61 12/02/2019 0257  ?December 2022: Hemoglobin A1c 5.9%, cholesterol 128, triglycerides 89, HDL 38, LDL 73, BUN 18, creatinine 1.13, potassium 4.0, AST 20, ALT 8, hemoglobin 13.3, platelets 161 ? ?Other Studies Reviewed Today: ? ?Echocardiogram 12/02/2019: ? 1. Left ventricular ejection fraction, by estimation, is 40 to 45%. The  ?left ventricle has mildly decreased function. The left ventricle  ?demonstrates regional wall motion abnormalities. Basal to mid  ?inferolateral and basal to mid inferior akinesis.  ?There is mild focal basal septal hypertrophy left ventricular hypertrophy  ?with mitral valve systolic anterior motion. No LV outflow gradient. Left  ?ventricular diastolic parameters are consistent with Grade I diastolic  ?dysfunction (impaired relaxation).  ? 2. Right ventricular systolic function is normal. The right ventricular  ?size is normal. There is normal pulmonary artery systolic pressure. The  ?estimated right ventricular systolic pressure is 35.7 mmHg.  ? 3. Left atrial size was mildly dilated.  ? 4. The mitral valve is abnormal with mitral valve systolic anterior  ?motion noted. Mild mitral valve regurgitation. No evidence of mitral  ?stenosis.  ? 5. The aortic valve is tricuspid. Aortic valve regurgitation is not  ?visualized. Mild aortic valve sclerosis is present, with no evidence of  ?aortic valve stenosis.  ? 6. The inferior vena cava is normal in size with greater than 50%  ?respiratory variability, suggesting right atrial pressure of 3 mmHg.  ? 7. Technically difficult study with poor acoustic windows.  ? ?Abdominal ultrasound 06/15/2021: ?FINDINGS: ?Abdominal aortic measurements as  follows: ?  ?Proximal:  2.7 cm (AP) /2.3 cm (TRV) ?  ?Mid:  2.1 cm (AP) 2.1 cm (TRV) ?  ?Distal:  3.5 cm (AP) /4.1 cm (TRV) ?Patent: Yes, peak systolic velocity is 26.4 cm/s ?  ?Right common iliac artery: 0.9 cm (AP) /1.2 cm (TRV) ?  ?Left common iliac artery: 0.9 cm (AP) /1.1 cm (TRV) ?  ?Other: The study is limited secondary to overlying bowel gas. ?  ?IMPRESSION: ?Predominant stable aneurysmal dilatation of the infrarenal abdominal ?aorta. Recommend followup by ultrasound in 2 years. This ?recommendation follows ACR consensus guidelines: White Paper of the ?ACR Incidental findings Committee II on Vascular Findings. J Am Coll ?Radiol 201304-07-1976. ? ?Assessment and Plan: ? ?1.  Multivessel CAD status post CABG in 2002 with subsequently documented graft disease managed medically.  He reports no active angina but worsening shortness of breath with activity.  We will obtain a follow-up echocardiogram.  Continue aspirin,  Plavix, Toprol-XL, Imdur, Lipitor, and as needed nitroglycerin. ? ?2.  HFrEF with ischemic cardiomyopathy, LVEF 40 to 45% as of 2021.  We will obtain a follow-up echocardiogram and look to add GDMT.  Relatively low blood pressure and heart rate at baseline may limit options however. ? ?3.  Asymptomatic infrarenal abdominal aortic aneurysm, relatively stable by ultrasound in December 2022.  Follow-up study in 2 years per recommendations. ? ?4.  Mixed hyperlipidemia on Lipitor.  Last LDL 73. ? ?Medication Adjustments/Labs and Tests Ordered: ?Current medicines are reviewed at length with the patient today.  Concerns regarding medicines are outlined above.  ? ?Tests Ordered: ?Orders Placed This Encounter  ?Procedures  ? ECHOCARDIOGRAM COMPLETE  ? ? ?Medication Changes: ?No orders of the defined types were placed in this encounter. ? ? ?Disposition:  Follow up  3 months. ? ?Signed, ?Jonelle Sidle, MD, [facility] ?11/14/2021 8:40 AM    ?[facility] at [facility] ?[address],  [place], [place] [address] ?Phone: [phone]; Fax: [phone]  ?

## 2021-11-14 ENCOUNTER — Ambulatory Visit (INDEPENDENT_AMBULATORY_CARE_PROVIDER_SITE_OTHER): Payer: Medicare HMO | Admitting: Cardiology

## 2021-11-14 ENCOUNTER — Encounter: Payer: Self-pay | Admitting: Cardiology

## 2021-11-14 VITALS — BP 118/60 | HR 60 | Ht 69.0 in | Wt 165.6 lb

## 2021-11-14 DIAGNOSIS — I7143 Infrarenal abdominal aortic aneurysm, without rupture: Secondary | ICD-10-CM | POA: Diagnosis not present

## 2021-11-14 DIAGNOSIS — I5020 Unspecified systolic (congestive) heart failure: Secondary | ICD-10-CM | POA: Diagnosis not present

## 2021-11-14 DIAGNOSIS — R0602 Shortness of breath: Secondary | ICD-10-CM

## 2021-11-14 DIAGNOSIS — I25119 Atherosclerotic heart disease of native coronary artery with unspecified angina pectoris: Secondary | ICD-10-CM

## 2021-11-14 DIAGNOSIS — J449 Chronic obstructive pulmonary disease, unspecified: Secondary | ICD-10-CM | POA: Diagnosis not present

## 2021-11-14 DIAGNOSIS — E782 Mixed hyperlipidemia: Secondary | ICD-10-CM | POA: Diagnosis not present

## 2021-11-14 NOTE — Patient Instructions (Signed)
Medication Instructions:  ?Your physician recommends that you continue on your current medications as directed. Please refer to the Current Medication list given to you today. ? ? ?Labwork: ?None today ? ?Testing/Procedures: ? ?Your physician has requested that you have an echocardiogram. Echocardiography is a painless test that uses sound waves to create images of your heart. It provides your doctor with information about the size and shape of your heart and how well your heart?s chambers and valves are working. This procedure takes approximately one hour. There are no restrictions for this procedure. ? ? ?Follow-Up: ? ?3 months ? ?Any Other Special Instructions Will Be Listed Below (If Applicable). ? ?If you need a refill on your cardiac medications before your next appointment, please call your pharmacy. ? ?

## 2021-11-23 ENCOUNTER — Ambulatory Visit (HOSPITAL_COMMUNITY): Admission: RE | Admit: 2021-11-23 | Payer: Medicare HMO | Source: Ambulatory Visit

## 2021-12-07 DIAGNOSIS — I2510 Atherosclerotic heart disease of native coronary artery without angina pectoris: Secondary | ICD-10-CM | POA: Diagnosis not present

## 2021-12-07 DIAGNOSIS — I10 Essential (primary) hypertension: Secondary | ICD-10-CM | POA: Diagnosis not present

## 2021-12-07 DIAGNOSIS — K219 Gastro-esophageal reflux disease without esophagitis: Secondary | ICD-10-CM | POA: Diagnosis not present

## 2021-12-07 DIAGNOSIS — E785 Hyperlipidemia, unspecified: Secondary | ICD-10-CM | POA: Diagnosis not present

## 2021-12-28 DIAGNOSIS — E785 Hyperlipidemia, unspecified: Secondary | ICD-10-CM | POA: Diagnosis not present

## 2021-12-28 DIAGNOSIS — R7303 Prediabetes: Secondary | ICD-10-CM | POA: Diagnosis not present

## 2022-01-02 DIAGNOSIS — I10 Essential (primary) hypertension: Secondary | ICD-10-CM | POA: Diagnosis not present

## 2022-01-02 DIAGNOSIS — I714 Abdominal aortic aneurysm, without rupture, unspecified: Secondary | ICD-10-CM | POA: Diagnosis not present

## 2022-01-02 DIAGNOSIS — I2510 Atherosclerotic heart disease of native coronary artery without angina pectoris: Secondary | ICD-10-CM | POA: Diagnosis not present

## 2022-01-02 DIAGNOSIS — N400 Enlarged prostate without lower urinary tract symptoms: Secondary | ICD-10-CM | POA: Diagnosis not present

## 2022-01-02 DIAGNOSIS — E785 Hyperlipidemia, unspecified: Secondary | ICD-10-CM | POA: Diagnosis not present

## 2022-01-02 DIAGNOSIS — R718 Other abnormality of red blood cells: Secondary | ICD-10-CM | POA: Diagnosis not present

## 2022-01-02 DIAGNOSIS — R7303 Prediabetes: Secondary | ICD-10-CM | POA: Diagnosis not present

## 2022-01-02 DIAGNOSIS — K219 Gastro-esophageal reflux disease without esophagitis: Secondary | ICD-10-CM | POA: Diagnosis not present

## 2022-01-02 DIAGNOSIS — I5020 Unspecified systolic (congestive) heart failure: Secondary | ICD-10-CM | POA: Diagnosis not present

## 2022-02-02 DIAGNOSIS — H25043 Posterior subcapsular polar age-related cataract, bilateral: Secondary | ICD-10-CM | POA: Diagnosis not present

## 2022-02-14 NOTE — Progress Notes (Unsigned)
Cardiology Office Note    Date:  02/15/2022   ID:  Ricardo Castillo, DOB 30-Jun-1936, MRN [id number]  PCP:  Benita Stabile, MD  Cardiologist: Nona Dell, MD    Chief Complaint  Patient presents with   Follow-up    3 month visit    History of Present Illness:    Ricardo Castillo is a 86 y.o. 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  He was examined by Dr. Diona Browner in 11/2021 and reported fatigue and NYHA class II dyspnea but denied any chest pain or palpitations. It was recommended he have a follow-up echocardiogram for reassessment of his EF and possible titration of GDMT but this had overall been limited secondary to bradycardia and hypotension. It appears his echo was ordered but never obtained.  In talking with the patient today, he reports remaining active at baseline as he has a small garden and has also recently been cutting down a tree. He has baseline dyspnea on exertion which he feels has been stable. Says this has been occurring since his MI in 2021. He denies any associated chest pain. No recent orthopnea, PND or pitting edema. He reports he was previously intolerant to statin therapy but has been on Atorvastatin and overall tolerated well thus far with no recent myalgias.   Past Medical History:  Diagnosis Date   Abdominal aortic aneurysm (HCC)    COPD (chronic obstructive pulmonary disease) (HCC)    Coronary atherosclerosis of native coronary artery    a. s/p CABG in 2002 b. cath in 11/2019 showing patent LIMA-LAD, patent SVG-D1, CTO of SVG-PDA and stenosis along SVG-OM1 wih collaterals noted and med management  recommended.    Essential hypertension    Hyperlipidemia    Mixed hyperlipidemia     Past Surgical History:  Procedure Laterality Date   CATARACT EXTRACTION W/PHACO Right 05/18/2014   Procedure: CATARACT EXTRACTION PHACO AND INTRAOCULAR LENS PLACEMENT (IOC);  Surgeon: Gemma Payor, MD;  Location: [facility];  Service: Ophthalmology;  Laterality: Right;  CDE 19.59   CATARACT EXTRACTION W/PHACO Left 06/01/2014   Procedure: CATARACT EXTRACTION PHACO AND INTRAOCULAR LENS PLACEMENT LEFT EYE;  Surgeon: Gemma Payor, MD;  Location: [facility];  Service: Ophthalmology;  Laterality: Left;  CDE 19.42   CORONARY ARTERY BYPASS GRAFT  11/09/00   LIMA to LAD, SVG OM and PLB   HERNIA REPAIR  1969   LEFT HEART CATH AND CORS/GRAFTS ANGIOGRAPHY N/A 12/01/2019   Procedure: LEFT HEART CATH AND CORS/GRAFTS ANGIOGRAPHY;  Surgeon: Yvonne Kendall, MD;  Location: [facility];  Service: Cardiovascular;  Laterality: N/A;   TONSILLECTOMY  Age 86    Current Medications: Outpatient Medications Prior to Visit  Medication Sig Dispense Refill   aspirin 81 MG tablet Take 81 mg by mouth daily.     atorvastatin (LIPITOR) 80 MG tablet Take 1 tablet (80 mg total) by mouth daily. 30 tablet 11   clopidogrel (PLAVIX) 75 MG tablet Take 1 tablet (75 mg total) by mouth daily with breakfast. 30 tablet 11   finasteride (PROSCAR) 5 MG tablet Take 5 mg by mouth daily.  isosorbide mononitrate (IMDUR) 30 MG 24 hr tablet Take 1 tablet (30 mg total) by mouth daily. 90 tablet 3   metoprolol succinate (TOPROL XL) 25 MG 24 hr tablet Take 0.5 tablets (12.5 mg total) by mouth daily. 45 tablet 3   nitroGLYCERIN (NITROSTAT) 0.4 MG SL tablet Place 1 tablet (0.4 mg total) under the tongue every 5 (five) minutes as needed for chest pain. 25 tablet 1   pantoprazole (PROTONIX) 20 MG tablet TAKE 1 TABLET EVERY DAY 90 tablet 3   terazosin (HYTRIN) 5 MG capsule Take 5 mg by mouth at bedtime.     No facility-administered medications prior to visit.      Allergies:   Other   Social History   Socioeconomic History   Marital status: Divorced    Spouse name: Not on file   Number of children: Not on file   Years of education: Not on file   Highest education level: Not on file  Occupational History   Not on file  Tobacco Use   Smoking status: Former    Years: 25.00    Types: Cigarettes    Quit date: 07/10/1988    Years since quitting: 33.6   Smokeless tobacco: Never  Vaping Use   Vaping Use: Never used  Substance and Sexual Activity   Alcohol use: No    Alcohol/week: 0.0 standard drinks of alcohol   Drug use: No   Sexual activity: Not on file  Other Topics Concern   Not on file  Social History Narrative   Not on file   Social Determinants of Health   Financial Resource Strain: Not on file  Food Insecurity: Not on file  Transportation Needs: Not on file  Physical Activity: Not on file  Stress: Not on file  Social Connections: Not on file     Family History:  The patient's family history includes Cancer in his sister; Kidney disease in his maternal grandfather; Sudden death in his maternal grandmother and mother; Thrombosis in his father.   Review of Systems:    Please see the history of present illness.     All other systems reviewed and are otherwise negative except as noted above.   Physical Exam:    VS:  BP 118/64   Pulse (!) 54   Ht 5\' 9"  (1.753 m)   Wt 160 lb 6.4 oz (72.8 kg)   SpO2 93%   BMI 23.69 kg/m    General: Well developed, well nourished,male appearing in no acute distress. Head: Normocephalic, atraumatic. Neck: No carotid bruits. JVD not elevated.  Lungs: Respirations regular and unlabored, without wheezes or rales.  Heart: Regular rate and rhythm. No S3 or S4. 2/6 SEM along RUSB.  Abdomen: Appears non-distended. No obvious abdominal masses. Msk:  Strength and tone appear normal for age. No obvious joint deformities or effusions. Extremities: No clubbing or cyanosis. No pitting edema.   Distal pedal pulses are 2+ bilaterally. Neuro: Alert and oriented X 3. Moves all extremities spontaneously. No focal deficits noted. Psych:  Responds to questions appropriately with a normal affect. Skin: No rashes or lesions noted  Wt Readings from Last 3 Encounters:  02/15/22 160 lb 6.4 oz (72.8 kg)  11/14/21 165 lb 9.6 oz (75.1 kg)  06/07/21 163 lb 6.4 oz (74.1 kg)     Studies/Labs Reviewed:   EKG:  EKG is not ordered today.    Recent Labs: No results found for requested labs within last 365 days.   Lipid Panel  Component Value Date/Time   CHOL 124 12/02/2019 0257   TRIG 46 12/02/2019 0257   HDL 54 12/02/2019 0257   CHOLHDL 2.3 12/02/2019 0257   VLDL 9 12/02/2019 0257   LDLCALC 61 12/02/2019 0257    Additional studies/ records that were reviewed today include:   LHC: 11/2019 Conclusions: Severe native coronary artery disease, including 90% proximal LAD stenosis, chronic total occlusion of the proximal LCx, and sequential 90% and 100% lesions involving non-dominant RCA. Widely patent LIMA-LAD. Patent SVG-D1 with mild disease (~20%). Heavily diseased SVG-OM1 with moderate diffuse proximal and mid graft disease and sequential 99% and 100% stenoses involving the distal graft and anastomosis to OM1.  This is likely the culprit vessel for the patient's NSTEMI. Chronically occluded SVG-LPL. Normal left ventricular filling pressure.   Recommendations: Aggressive medical therapy, including 48 hours of IV heparin and 12 months of dual antiplatelet therapy with aspirin and clopidogrel.  I do not believe that PCI to SVG-OM1 would provide much benefit, as Mr. Liller is asymptomatic at this time on low-dose IV nitroglycerin and onset of symptoms was at least 2 days ago.  Also, the graft is diffusely diseased and supplies a relatively small branch that fills by left-to-left collaterals.  PCI to SVG-OM1 could introduce significant microvascular dysfunction/no-flow through embolization of  thrombus and atheroma in the almost 84 year old graft. Secondary prevention, including high-intensity statin therapy. Obtain transthoracic echocardiogram.  Echocardiogram: 11/2019 IMPRESSIONS     1. Left ventricular ejection fraction, by estimation, is 40 to 45%. The  left ventricle has mildly decreased function. The left ventricle  demonstrates regional wall motion abnormalities. Basal to mid  inferolateral and basal to mid inferior akinesis.  There is mild focal basal septal hypertrophy left ventricular hypertrophy  with mitral valve systolic anterior motion. No LV outflow gradient. Left  ventricular diastolic parameters are consistent with Grade I diastolic  dysfunction (impaired relaxation).   2. Right ventricular systolic function is normal. The right ventricular  size is normal. There is normal pulmonary artery systolic pressure. The  estimated right ventricular systolic pressure is 35.7 mmHg.   3. Left atrial size was mildly dilated.   4. The mitral valve is abnormal with mitral valve systolic anterior  motion noted. Mild mitral valve regurgitation. No evidence of mitral  stenosis.   5. The aortic valve is tricuspid. Aortic valve regurgitation is not  visualized. Mild aortic valve sclerosis is present, with no evidence of  aortic valve stenosis.   6. The inferior vena cava is normal in size with greater than 50%  respiratory variability, suggesting right atrial pressure of 3 mmHg.   7. Technically difficult study with poor acoustic windows.   AAA [place]: 06/2021 IMPRESSION: Predominant stable aneurysmal dilatation of the infrarenal abdominal aorta. Recommend followup by ultrasound in 2 years. This recommendation follows ACR consensus guidelines: White Paper of the ACR Incidental findings Committee II on Vascular Findings. J Am Coll Radiol 2013; 10:789-794.  Assessment:    1. Coronary artery disease involving native coronary artery of native heart with angina pectoris (HCC)    2. Dyspnea on exertion   3. Ischemic cardiomyopathy   4. Infrarenal abdominal aortic aneurysm (AAA) without rupture (HCC)   5. Hyperlipidemia LDL goal <70      Plan:   In order of problems listed above:  1. CAD/Dyspnea on Exertion - He is s/p CABG in 2002 with cath in 11/2019 showing patent LIMA-LAD, patent SVG-D1 and CTO of SVG-PDA and stenosis along SVG-OM1 with collaterals present  and medical management recommended.  - He has baseline dyspnea on exertion which has overall been unchanged for the past few years. Denies any chest pain. Will plan to obtain a follow-up echocardiogram as discussed at his last visit.  - Continue current medical therapy with ASA 81mg  daily, Plavix 75mg  daily, Atorvastatin 80mg  daily, Imdur 30mg  daily and Toprol-XL 12.5mg  daily.   2. HFmrEF  - His EF was at 40-45% in 11/2019. He was previously scheduled for a follow-up echocardiogram but says he was unaware of the appointment. Will reschedule this. He remains on Imdur 30 mg daily and Toprol-XL 12.5 mg daily. Could consider adding an SGLT2 inhibitor if EF remains reduced. He reports having hypotension/presyncope with other medication changes in the past which likely previously limited the use of ACE-I/ARB. Unable to further titrate his beta-blocker given heart rate in the 50's.  3. AAA - Abdominal in 06/2021 showed stable dilatation of the infrarenal abdominal aorta with repeat imaging recommended in 2 years.   4. HLD - Followed by his PCP. LDL was at 66 when checked in 12/2021. Remains on Atorvastatin 80mg  daily.    Medication Adjustments/Labs and Tests Ordered: Current medicines are reviewed at length with the patient today.  Concerns regarding medicines are outlined above.  Medication changes, Labs and Tests ordered today are listed in the Patient Instructions below. Patient Instructions  Medication Instructions:   Continue current regimen for now.   *If you need a refill on your cardiac medications  before your next appointment, please call your pharmacy*   Lab Work:  None   Follow-Up: At [facility], you and your health needs are our priority.  As part of our continuing mission to provide you with exceptional heart care, we have created designated Provider Care Teams.  These Care Teams include your primary Cardiologist (physician) and Advanced Practice Providers (APPs -  Physician Assistants and Nurse Practitioners) who all work together to provide you with the care you need, when you need it.  We recommend signing up for the patient portal called "MyChart".  Sign up information is provided on this After Visit Summary.  MyChart is used to connect with patients for Virtual Visits (Telemedicine).  Patients are able to view lab/test results, encounter notes, upcoming appointments, etc.  Non-urgent messages can be sent to your provider as well.   To learn more about what you can do with MyChart, go to [place].    Your next appointment:   4 month(s)  The format for your next appointment:   In Person  Provider:   You may see 07/2021, MD or one of the following Advanced Practice Providers on your designated Care Team:   01/2022, PA-C  , [facility] {  Important Information About Sugar         Signed, [email or web address], PA-C  02/15/2022 2:08 PM    [facility] [address] [place], [place] Phone: [phone] Fax: [phone]

## 2022-02-15 ENCOUNTER — Ambulatory Visit: Payer: Medicare HMO | Admitting: Student

## 2022-02-15 ENCOUNTER — Encounter: Payer: Self-pay | Admitting: Student

## 2022-02-15 VITALS — BP 118/64 | HR 54 | Ht 69.0 in | Wt 160.4 lb

## 2022-02-15 DIAGNOSIS — I25119 Atherosclerotic heart disease of native coronary artery with unspecified angina pectoris: Secondary | ICD-10-CM

## 2022-02-15 DIAGNOSIS — E785 Hyperlipidemia, unspecified: Secondary | ICD-10-CM

## 2022-02-15 DIAGNOSIS — R0609 Other forms of dyspnea: Secondary | ICD-10-CM

## 2022-02-15 DIAGNOSIS — I255 Ischemic cardiomyopathy: Secondary | ICD-10-CM | POA: Diagnosis not present

## 2022-02-15 DIAGNOSIS — I7143 Infrarenal abdominal aortic aneurysm, without rupture: Secondary | ICD-10-CM

## 2022-02-15 NOTE — Patient Instructions (Signed)
Medication Instructions:   Continue current regimen for now.   *If you need a refill on your cardiac medications before your next appointment, please call your pharmacy*   Lab Work:  None   Follow-Up: At [facility], you and your health needs are our priority.  As part of our continuing mission to provide you with exceptional heart care, we have created designated Provider Care Teams.  These Care Teams include your primary Cardiologist (physician) and Advanced Practice Providers (APPs -  Physician Assistants and Nurse Practitioners) who all work together to provide you with the care you need, when you need it.  We recommend signing up for the patient portal called "MyChart".  Sign up information is provided on this After Visit Summary.  MyChart is used to connect with patients for Virtual Visits (Telemedicine).  Patients are able to view lab/test results, encounter notes, upcoming appointments, etc.  Non-urgent messages can be sent to your provider as well.   To learn more about what you can do with MyChart, go to [email or web address].    Your next appointment:   4 month(s)  The format for your next appointment:   In Person  Provider:   You may see Nona Dell, MD or one of the following Advanced Practice Providers on your designated Care Team:   Randall An, PA-C  Jacolyn Reedy, PA-C {  Important Information About Sugar

## 2022-02-27 ENCOUNTER — Ambulatory Visit (HOSPITAL_COMMUNITY)
Admission: RE | Admit: 2022-02-27 | Discharge: 2022-02-27 | Disposition: A | Discharge status: Home / Self Care | Payer: Medicare HMO | Source: Ambulatory Visit | Attending: Cardiology | Admitting: Cardiology

## 2022-02-27 DIAGNOSIS — H01001 Unspecified blepharitis right upper eyelid: Secondary | ICD-10-CM | POA: Diagnosis not present

## 2022-02-27 DIAGNOSIS — H01002 Unspecified blepharitis right lower eyelid: Secondary | ICD-10-CM | POA: Diagnosis not present

## 2022-02-27 DIAGNOSIS — R0602 Shortness of breath: Secondary | ICD-10-CM | POA: Insufficient documentation

## 2022-02-27 DIAGNOSIS — H01004 Unspecified blepharitis left upper eyelid: Secondary | ICD-10-CM | POA: Diagnosis not present

## 2022-02-27 DIAGNOSIS — H26493 Other secondary cataract, bilateral: Secondary | ICD-10-CM | POA: Diagnosis not present

## 2022-02-27 LAB — ECHOCARDIOGRAM COMPLETE
AR max vel: 2.15 cm2
AV Area VTI: 2.44 cm2
AV Area mean vel: 2.4 cm2
AV Mean grad: 2.7 mmHg
AV Peak grad: 5.3 mmHg
Ao pk vel: 1.15 m/s
Area-P 1/2: 3.48 cm2
S' Lateral: 4.1 cm

## 2022-02-27 NOTE — Progress Notes (Signed)
*  PRELIMINARY RESULTS* Echocardiogram 2D Echocardiogram has been performed.  Stacey Drain 02/27/2022, 2:52 PM

## 2022-03-06 ENCOUNTER — Telehealth: Payer: Self-pay

## 2022-03-06 NOTE — Telephone Encounter (Signed)
Patient notified and verbalized understanding of results. Patient had no new questions or concerns at this time. PCP notified.

## 2022-03-06 NOTE — Telephone Encounter (Signed)
-----   Message from Jonelle Sidle, MD sent at 02/27/2022  3:59 PM EDT ----- Results reviewed.  LVEF has normalized comparison to prior evaluation, now 55 to 60%.  Normal estimated pulmonary artery systolic pressure as well.  Only mild mitral regurgitation and mild calcification of the aortic valve.  No new medications at this time, keep follow-up as scheduled.

## 2022-03-09 DIAGNOSIS — K219 Gastro-esophageal reflux disease without esophagitis: Secondary | ICD-10-CM | POA: Diagnosis not present

## 2022-03-09 DIAGNOSIS — I10 Essential (primary) hypertension: Secondary | ICD-10-CM | POA: Diagnosis not present

## 2022-03-09 DIAGNOSIS — I2510 Atherosclerotic heart disease of native coronary artery without angina pectoris: Secondary | ICD-10-CM | POA: Diagnosis not present

## 2022-03-09 DIAGNOSIS — E785 Hyperlipidemia, unspecified: Secondary | ICD-10-CM | POA: Diagnosis not present

## 2022-03-31 DIAGNOSIS — Z23 Encounter for immunization: Secondary | ICD-10-CM | POA: Diagnosis not present

## 2022-03-31 DIAGNOSIS — Z0000 Encounter for general adult medical examination without abnormal findings: Secondary | ICD-10-CM | POA: Diagnosis not present

## 2022-04-26 ENCOUNTER — Other Ambulatory Visit: Payer: Self-pay | Admitting: Cardiology

## 2022-06-30 DIAGNOSIS — E785 Hyperlipidemia, unspecified: Secondary | ICD-10-CM | POA: Diagnosis not present

## 2022-06-30 DIAGNOSIS — R7303 Prediabetes: Secondary | ICD-10-CM | POA: Diagnosis not present

## 2022-07-06 ENCOUNTER — Encounter: Payer: Self-pay | Admitting: Internal Medicine

## 2022-07-06 ENCOUNTER — Other Ambulatory Visit (HOSPITAL_COMMUNITY): Payer: Self-pay | Admitting: Internal Medicine

## 2022-07-06 DIAGNOSIS — K219 Gastro-esophageal reflux disease without esophagitis: Secondary | ICD-10-CM | POA: Diagnosis not present

## 2022-07-06 DIAGNOSIS — I714 Abdominal aortic aneurysm, without rupture, unspecified: Secondary | ICD-10-CM | POA: Diagnosis not present

## 2022-07-06 DIAGNOSIS — I2510 Atherosclerotic heart disease of native coronary artery without angina pectoris: Secondary | ICD-10-CM | POA: Diagnosis not present

## 2022-07-06 DIAGNOSIS — N400 Enlarged prostate without lower urinary tract symptoms: Secondary | ICD-10-CM | POA: Diagnosis not present

## 2022-07-06 DIAGNOSIS — R7303 Prediabetes: Secondary | ICD-10-CM | POA: Diagnosis not present

## 2022-07-06 DIAGNOSIS — I5021 Acute systolic (congestive) heart failure: Secondary | ICD-10-CM | POA: Diagnosis not present

## 2022-07-06 DIAGNOSIS — I5020 Unspecified systolic (congestive) heart failure: Secondary | ICD-10-CM | POA: Diagnosis not present

## 2022-07-06 DIAGNOSIS — E785 Hyperlipidemia, unspecified: Secondary | ICD-10-CM | POA: Diagnosis not present

## 2022-07-06 DIAGNOSIS — R0609 Other forms of dyspnea: Secondary | ICD-10-CM

## 2022-07-06 DIAGNOSIS — I10 Essential (primary) hypertension: Secondary | ICD-10-CM | POA: Diagnosis not present

## 2022-07-20 ENCOUNTER — Ambulatory Visit: Payer: Medicare HMO | Admitting: Cardiology

## 2022-07-20 NOTE — Progress Notes (Deleted)
Cardiology Office Note  Date: 07/20/2022   ID: Ricardo Castillo, DOB 1936-02-26, MRN [id number]  PCP:  Celene Squibb, MD  Cardiologist:  Rozann Lesches, MD Electrophysiologist:  None   No chief complaint on file.   History of Present Illness: Ricardo Castillo is an 87 y.o. male last seen in August 2023 by Ms. Strader PA-C, I reviewed the note.  Follow-up echocardiogram in August 2023 revealed normal LVEF at 55 to 60%.  Past Medical History:  Diagnosis Date   Abdominal aortic aneurysm (HCC)    COPD (chronic obstructive pulmonary disease) ([place])    Coronary atherosclerosis of native coronary artery    a. s/p CABG in 2002 b. cath in 11/2019 showing patent LIMA-LAD, patent SVG-D1, CTO of SVG-PDA and stenosis along SVG-OM1 wih collaterals noted and med management recommended.    Essential hypertension    Hyperlipidemia    Mixed hyperlipidemia     Current Outpatient Medications  Medication Sig Dispense Refill   aspirin 81 MG tablet Take 81 mg by mouth daily.     atorvastatin (LIPITOR) 80 MG tablet Take 1 tablet (80 mg total) by mouth daily. 30 tablet 11   clopidogrel (PLAVIX) 75 MG tablet Take 1 tablet (75 mg total) by mouth daily with breakfast. 30 tablet 11   finasteride (PROSCAR) 5 MG tablet Take 5 mg by mouth daily.     isosorbide mononitrate (IMDUR) 30 MG 24 hr tablet TAKE 1 TABLET(30 MG) BY MOUTH DAILY 90 tablet 3   metoprolol succinate (TOPROL XL) 25 MG 24 hr tablet Take 0.5 tablets (12.5 mg total) by mouth daily. 45 tablet 3   nitroGLYCERIN (NITROSTAT) 0.4 MG SL tablet Place 1 tablet (0.4 mg total) under the tongue every 5 (five) minutes as needed for chest pain. 25 tablet 1   pantoprazole (PROTONIX) 20 MG tablet TAKE 1 TABLET EVERY DAY 90 tablet 3   terazosin (HYTRIN) 5 MG capsule Take 5 mg by mouth at bedtime.     No current facility-administered medications for this visit.   Allergies:  Other   ROS:  Please see the history of present illness. Otherwise, complete review  of systems is positive for {NONE DEFAULTED:18576}.  All other systems are reviewed and negative.   Physical Exam: VS:  There were no vitals taken for this visit., BMI There is no height or weight on file to calculate BMI.  Wt Readings from Last 3 Encounters:  02/15/22 160 lb 6.4 oz (72.8 kg)  11/14/21 165 lb 9.6 oz (75.1 kg)  06/07/21 163 lb 6.4 oz (74.1 kg)    General: Patient appears comfortable at rest. HEENT: Conjunctiva and lids normal, oropharynx clear with moist mucosa. Neck: Supple, no elevated JVP or carotid bruits, no thyromegaly. Lungs: Clear to auscultation, nonlabored breathing at rest. Cardiac: Regular rate and rhythm, no S3 or significant systolic murmur, no pericardial rub. Abdomen: Soft, nontender, no hepatomegaly, bowel sounds present, no guarding or rebound. Extremities: No pitting edema, distal pulses 2+. Skin: Warm and dry. Musculoskeletal: No kyphosis. Neuropsychiatric: Alert and oriented x3, affect grossly appropriate.  ECG:  An ECG dated 06/07/2021 was personally reviewed today and demonstrated:  Sinus bradycardia with diffuse nonspecific ST-T wave changes.  Recent Labwork:  December 2023: Hemoglobin A1c 6%, cholesterol 123, triglycerides 88, HDL 42, LDL 64, BUN 17, creatinine 1.2, potassium 4.7, AST 22, ALT 10, hemoglobin 13.0, platelets 143  Other Studies Reviewed Today:  Abdominal ultrasound 06/15/2021: FINDINGS: Abdominal aortic measurements as follows:   Proximal:  2.7 cm (  AP) /2.3 cm (TRV)   Mid:  2.1 cm (AP) 2.1 cm (TRV)   Distal:  3.5 cm (AP) /4.1 cm (TRV) Patent: Yes, peak systolic velocity is 99991111 cm/s   Right common iliac artery: 0.9 cm (AP) /1.2 cm (TRV)   Left common iliac artery: 0.9 cm (AP) /1.1 cm (TRV)   Other: The study is limited secondary to overlying bowel gas.   IMPRESSION: Predominant stable aneurysmal dilatation of the infrarenal abdominal aorta. Recommend followup by ultrasound in 2 years. This recommendation follows ACR  consensus guidelines: White Paper of the ACR Incidental findings Committee II on Vascular Findings. J Am Coll Radiol 2013; 10:789-794.  Echocardiogram 02/27/2022:  1. Left ventricular ejection fraction, by estimation, is 55 to 60%. The  left ventricle has normal function. The left ventricle has no regional  wall motion abnormalities. Left ventricular diastolic parameters are  consistent with Grade I diastolic  dysfunction (impaired relaxation).   2. Right ventricular systolic function is normal. The right ventricular  size is normal. There is normal pulmonary artery systolic pressure.   3. The mitral valve is abnormal. Mild mitral valve regurgitation. No  evidence of mitral stenosis.   4. The aortic valve has an indeterminant number of cusps. There is mild  calcification of the aortic valve. There is mild thickening of the aortic  valve. Aortic valve regurgitation is not visualized. No aortic stenosis is  present.   5. The inferior vena cava is normal in size with greater than 50%  respiratory variability, suggesting right atrial pressure of 3 mmHg.   Assessment and Plan:   Medication Adjustments/Labs and Tests Ordered: Current medicines are reviewed at length with the patient today.  Concerns regarding medicines are outlined above.   Tests Ordered: No orders of the defined types were placed in this encounter.   Medication Changes: No orders of the defined types were placed in this encounter.   Disposition:  Follow up {follow up:15908}  Signed, Satira Sark, MD, [facility] 07/20/2022 10:07 AM    [facility] [place]. [address] [place], [place] [address] Phone: [phone]; Fax: [phone]

## 2022-07-21 ENCOUNTER — Encounter: Payer: Self-pay | Admitting: Cardiology

## 2022-08-04 ENCOUNTER — Other Ambulatory Visit (HOSPITAL_COMMUNITY): Payer: Self-pay | Admitting: Radiology

## 2022-08-04 DIAGNOSIS — R0609 Other forms of dyspnea: Secondary | ICD-10-CM

## 2022-09-05 ENCOUNTER — Encounter (HOSPITAL_COMMUNITY): Payer: Medicare HMO

## 2022-09-05 ENCOUNTER — Ambulatory Visit (HOSPITAL_COMMUNITY): Payer: Medicare HMO

## 2022-09-21 DIAGNOSIS — H5203 Hypermetropia, bilateral: Secondary | ICD-10-CM | POA: Diagnosis not present

## 2022-10-17 ENCOUNTER — Ambulatory Visit (HOSPITAL_COMMUNITY)
Admission: RE | Admit: 2022-10-17 | Discharge: 2022-10-17 | Disposition: A | Discharge status: Home / Self Care | Payer: Medicare HMO | Source: Ambulatory Visit | Attending: Internal Medicine | Admitting: Internal Medicine

## 2022-10-17 DIAGNOSIS — R0609 Other forms of dyspnea: Secondary | ICD-10-CM | POA: Diagnosis not present

## 2022-10-17 LAB — PULMONARY FUNCTION TEST
DL/VA % pred: 49 %
DL/VA: 1.88 ml/min/mmHg/L
DLCO unc % pred: 40 %
DLCO unc: 9.28 ml/min/mmHg
FEF 25-75 Post: 0.5 L/sec
FEF 25-75 Pre: 0.34 L/sec
FEF2575-%Change-Post: 47 %
FEF2575-%Pred-Post: 30 %
FEF2575-%Pred-Pre: 20 %
FEV1-%Change-Post: 5 %
FEV1-%Pred-Post: 53 %
FEV1-%Pred-Pre: 50 %
FEV1-Post: 1.37 L
FEV1-Pre: 1.3 L
FEV1FVC-%Change-Post: -14 %
FEV1FVC-%Pred-Pre: 64 %
FEV6-%Change-Post: 15 %
FEV6-%Pred-Post: 78 %
FEV6-%Pred-Pre: 68 %
FEV6-Post: 2.69 L
FEV6-Pre: 2.33 L
FEV6FVC-%Change-Post: -6 %
FEV6FVC-%Pred-Post: 82 %
FEV6FVC-%Pred-Pre: 87 %
FVC-%Change-Post: 23 %
FVC-%Pred-Post: 95 %
FVC-%Pred-Pre: 78 %
FVC-Post: 3.54 L
FVC-Pre: 2.88 L
Post FEV1/FVC ratio: 39 %
Post FEV6/FVC ratio: 76 %
Pre FEV1/FVC ratio: 45 %
Pre FEV6/FVC Ratio: 81 %
RV % pred: 155 %
RV: 4.18 L
TLC % pred: 104 %
TLC: 7.16 L

## 2022-10-17 MED ORDER — ALBUTEROL SULFATE (2.5 MG/3ML) 0.083% IN NEBU
2.5000 mg | INHALATION_SOLUTION | Freq: Once | RESPIRATORY_TRACT | Status: AC
  Administered 2022-10-17: 2.5 mg via RESPIRATORY_TRACT

## 2022-12-25 DIAGNOSIS — R7303 Prediabetes: Secondary | ICD-10-CM | POA: Diagnosis not present

## 2022-12-25 DIAGNOSIS — N400 Enlarged prostate without lower urinary tract symptoms: Secondary | ICD-10-CM | POA: Diagnosis not present

## 2022-12-25 DIAGNOSIS — E785 Hyperlipidemia, unspecified: Secondary | ICD-10-CM | POA: Diagnosis not present

## 2023-01-01 IMAGING — US US AORTA
1 series · 14 of 25 positions shown · non-contrast
Comparison: July 09, 2018

CLINICAL DATA: Follow-up of known abdominal aortic aneurysm.

EXAM:
ULTRASOUND OF ABDOMINAL AORTA
TECHNIQUE: Ultrasound examination of the abdominal aorta and proximal common
iliac arteries was performed to evaluate for aneurysm. Additional
color and Doppler images of the distal aorta were obtained to
document patency.

[Series 1: us aorta · 14 of 32 slices shown]
[im 1/32]
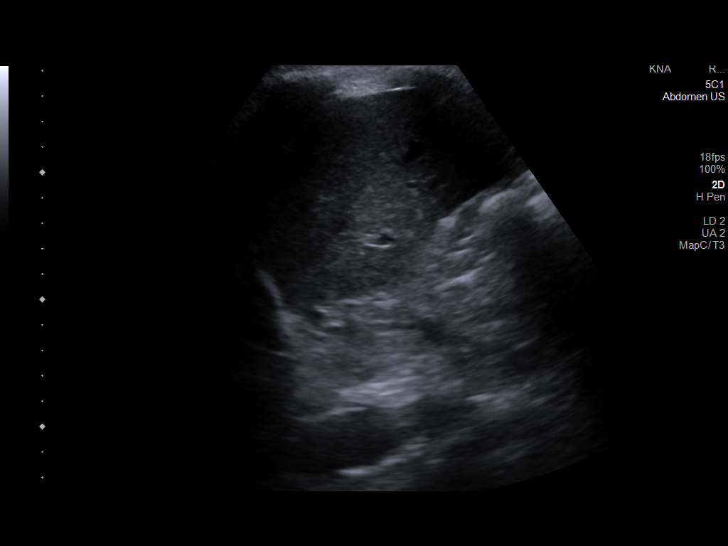
[im 3/32]
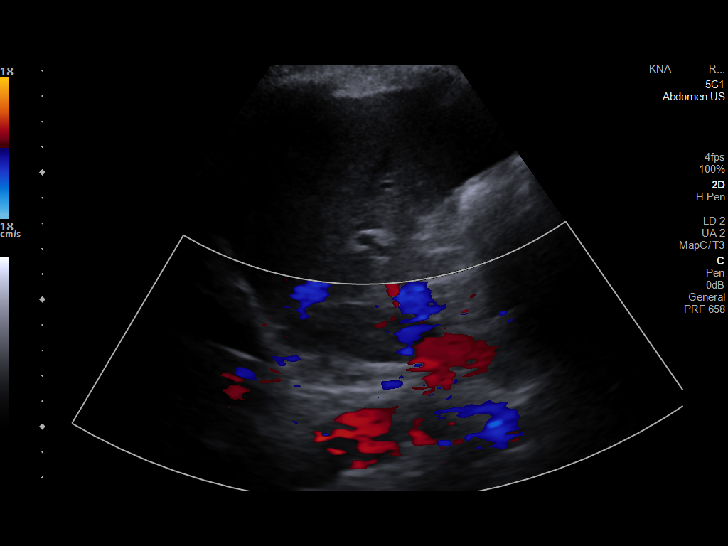
[im 6/32]
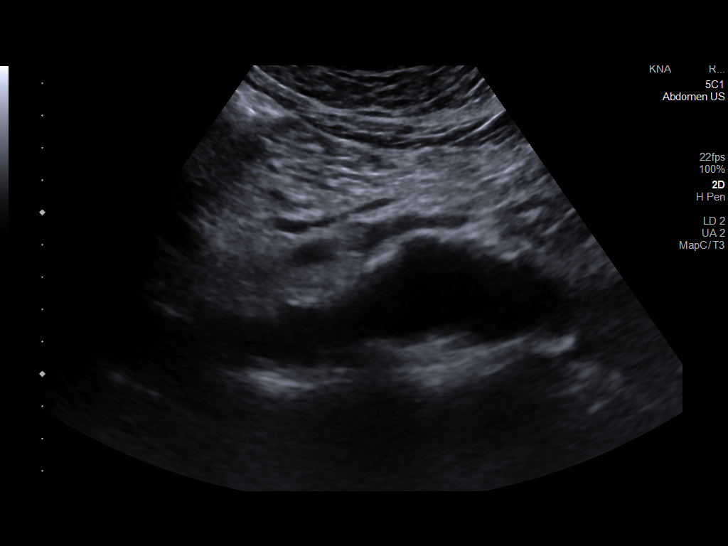
[im 8/32]
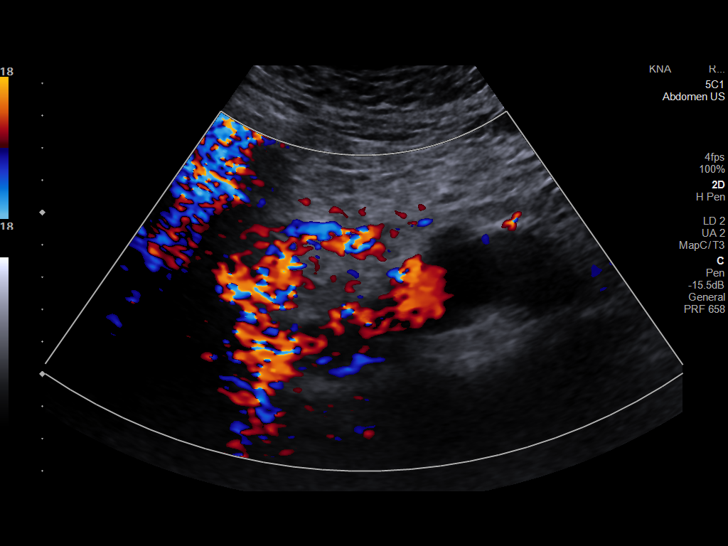
[im 11/32]
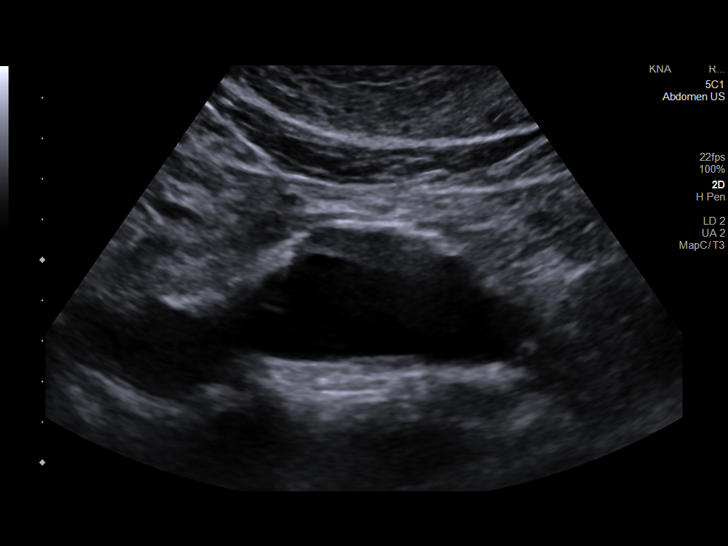
[im 12/32]
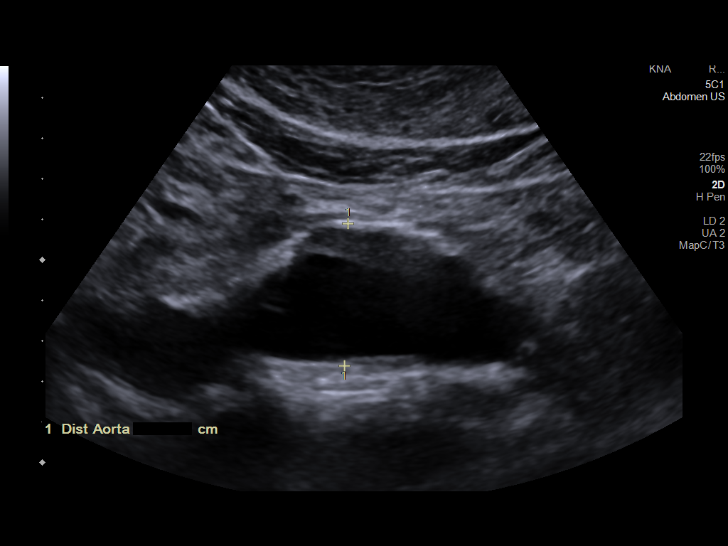
[im 15/32]
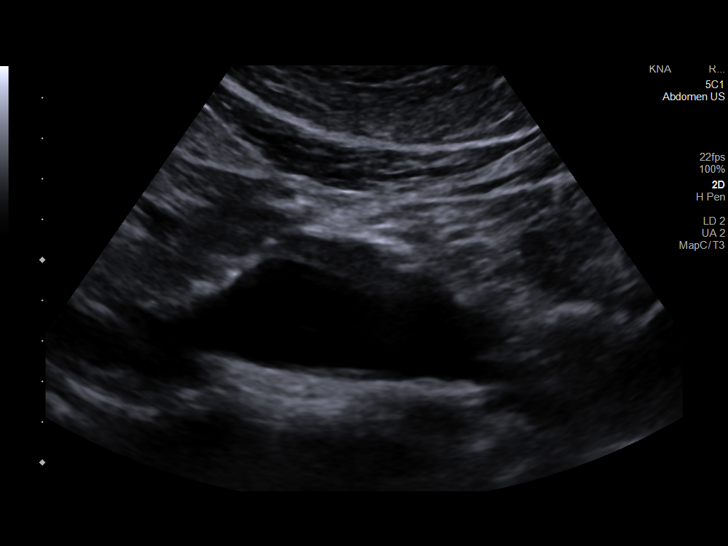
[im 17/32]
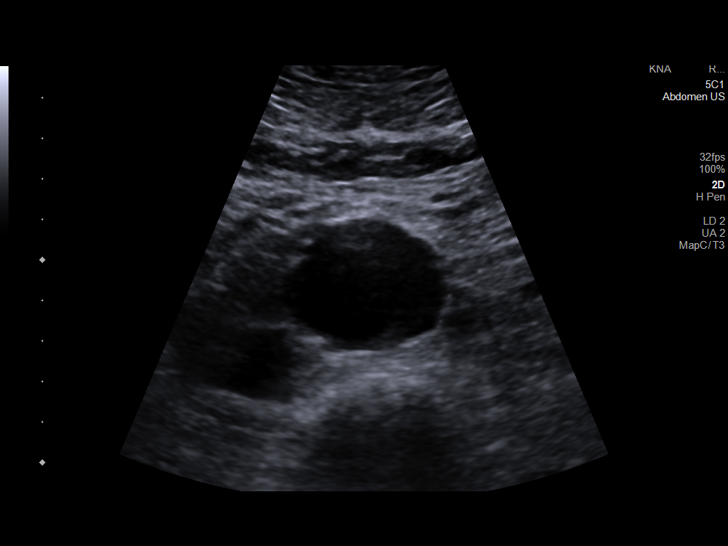
[im 20/32]
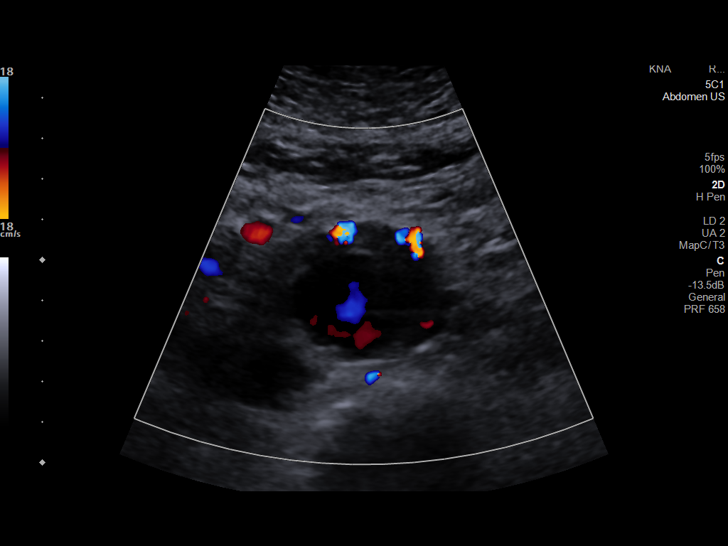
[im 21/32]
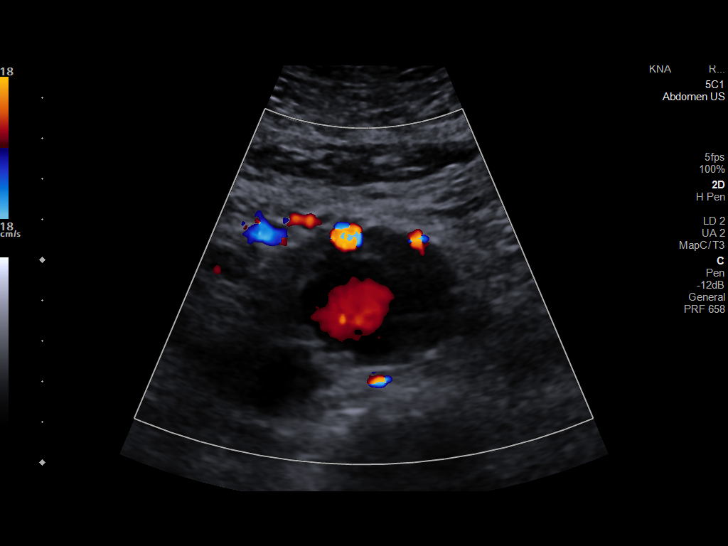
[im 24/32]
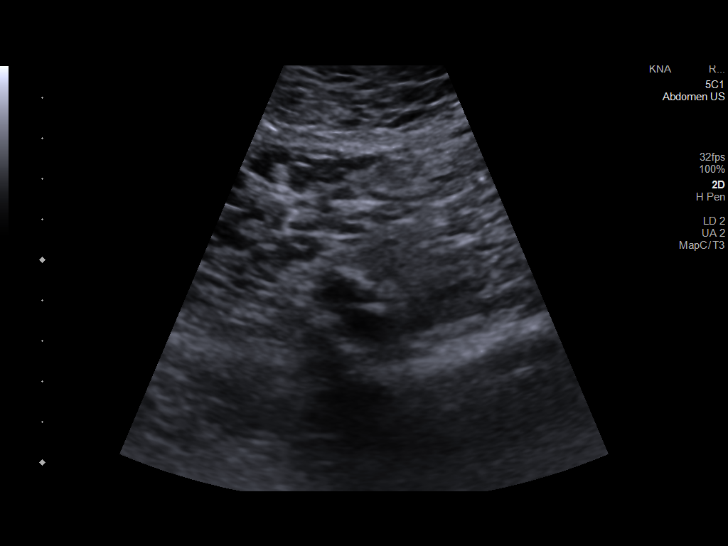
[im 26/32]
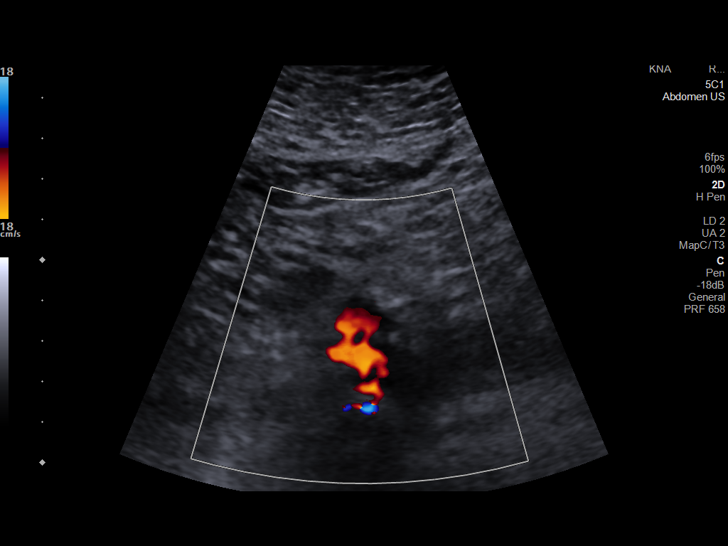
[im 29/32]
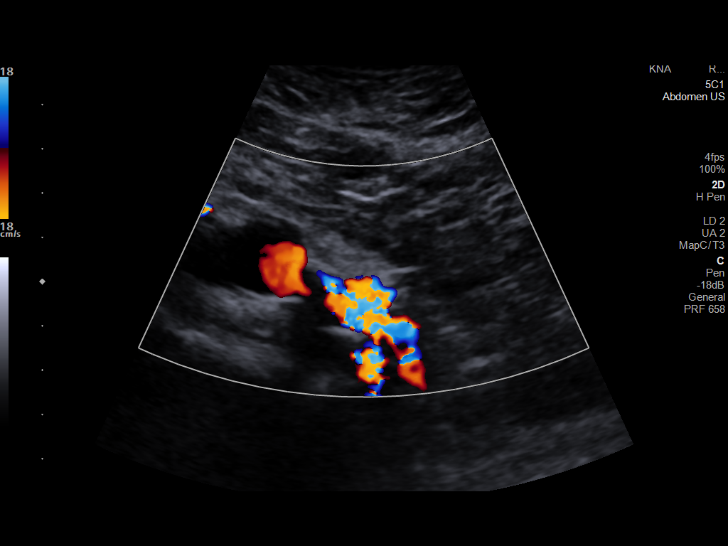
[im 32/32]
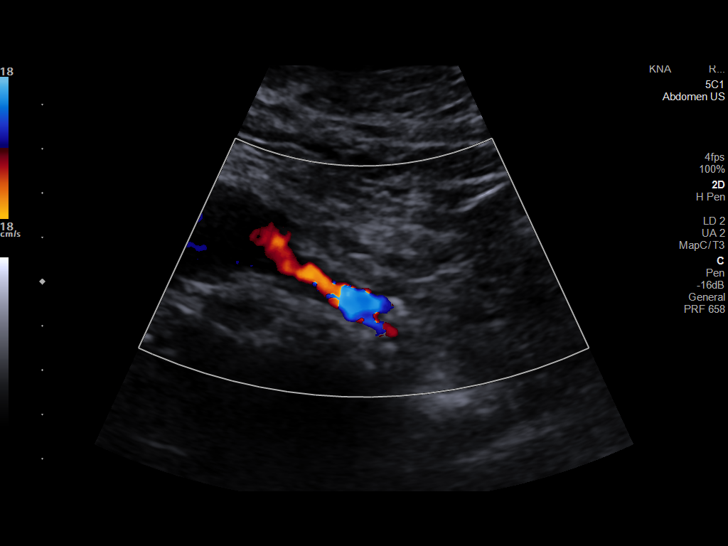

[14 of 25 positions shown; findings below may reference images not displayed]

FINDINGS: Abdominal aortic measurements as follows:

Proximal:  2.7 cm (AP) /2.3 cm (TRV)

Mid:  2.1 cm (AP) 2.1 cm (TRV)

Distal:  3.5 cm (AP) /4.1 cm (TRV)
Patent: Yes, peak systolic velocity is 26.4 cm/s

Right common iliac artery: 0.9 cm (AP) /1.2 cm (TRV)

Left common iliac artery: 0.9 cm (AP) /1.1 cm (TRV)

Other: The study is limited secondary to overlying bowel gas.
IMPRESSION: Predominant stable aneurysmal dilatation of the infrarenal abdominal
aorta. Recommend followup by ultrasound in 2 years. This
recommendation follows ACR consensus guidelines: White Paper of the
ACR Incidental findings Committee II on Vascular Findings. [HOSPITAL] 5768; [DATE].

## 2023-01-10 DIAGNOSIS — Z0000 Encounter for general adult medical examination without abnormal findings: Secondary | ICD-10-CM | POA: Diagnosis not present

## 2023-01-10 DIAGNOSIS — R7303 Prediabetes: Secondary | ICD-10-CM | POA: Diagnosis not present

## 2023-01-10 DIAGNOSIS — I2510 Atherosclerotic heart disease of native coronary artery without angina pectoris: Secondary | ICD-10-CM | POA: Diagnosis not present

## 2023-01-10 DIAGNOSIS — I110 Hypertensive heart disease with heart failure: Secondary | ICD-10-CM | POA: Diagnosis not present

## 2023-01-10 DIAGNOSIS — I10 Essential (primary) hypertension: Secondary | ICD-10-CM | POA: Diagnosis not present

## 2023-01-10 DIAGNOSIS — I5020 Unspecified systolic (congestive) heart failure: Secondary | ICD-10-CM | POA: Diagnosis not present

## 2023-01-10 DIAGNOSIS — I714 Abdominal aortic aneurysm, without rupture, unspecified: Secondary | ICD-10-CM | POA: Diagnosis not present

## 2023-01-10 DIAGNOSIS — E785 Hyperlipidemia, unspecified: Secondary | ICD-10-CM | POA: Diagnosis not present

## 2023-01-10 DIAGNOSIS — K219 Gastro-esophageal reflux disease without esophagitis: Secondary | ICD-10-CM | POA: Diagnosis not present

## 2023-04-11 DIAGNOSIS — Z23 Encounter for immunization: Secondary | ICD-10-CM | POA: Diagnosis not present

## 2023-05-22 ENCOUNTER — Encounter (HOSPITAL_COMMUNITY): Payer: Self-pay | Admitting: Emergency Medicine

## 2023-05-22 ENCOUNTER — Emergency Department (HOSPITAL_COMMUNITY): Payer: Medicare HMO

## 2023-05-22 ENCOUNTER — Emergency Department (HOSPITAL_COMMUNITY)
Admission: EM | Admit: 2023-05-22 | Discharge: 2023-06-10 | Disposition: E | Payer: Medicare HMO | Attending: Emergency Medicine | Admitting: Emergency Medicine

## 2023-05-22 ENCOUNTER — Other Ambulatory Visit: Payer: Self-pay

## 2023-05-22 DIAGNOSIS — R0602 Shortness of breath: Secondary | ICD-10-CM | POA: Insufficient documentation

## 2023-05-22 DIAGNOSIS — Z7982 Long term (current) use of aspirin: Secondary | ICD-10-CM | POA: Insufficient documentation

## 2023-05-22 DIAGNOSIS — I4891 Unspecified atrial fibrillation: Secondary | ICD-10-CM | POA: Insufficient documentation

## 2023-05-22 DIAGNOSIS — I469 Cardiac arrest, cause unspecified: Secondary | ICD-10-CM | POA: Insufficient documentation

## 2023-05-22 DIAGNOSIS — Z7901 Long term (current) use of anticoagulants: Secondary | ICD-10-CM | POA: Insufficient documentation

## 2023-05-22 DIAGNOSIS — J449 Chronic obstructive pulmonary disease, unspecified: Secondary | ICD-10-CM | POA: Insufficient documentation

## 2023-05-22 DIAGNOSIS — R000 Tachycardia, unspecified: Secondary | ICD-10-CM | POA: Diagnosis not present

## 2023-05-22 DIAGNOSIS — I10 Essential (primary) hypertension: Secondary | ICD-10-CM | POA: Diagnosis not present

## 2023-05-22 DIAGNOSIS — I2510 Atherosclerotic heart disease of native coronary artery without angina pectoris: Secondary | ICD-10-CM | POA: Diagnosis not present

## 2023-05-22 DIAGNOSIS — Z951 Presence of aortocoronary bypass graft: Secondary | ICD-10-CM | POA: Insufficient documentation

## 2023-05-22 DIAGNOSIS — R079 Chest pain, unspecified: Secondary | ICD-10-CM | POA: Diagnosis not present

## 2023-05-22 DIAGNOSIS — I959 Hypotension, unspecified: Secondary | ICD-10-CM | POA: Diagnosis not present

## 2023-05-22 LAB — CBC WITH DIFFERENTIAL/PLATELET
Abs Immature Granulocytes: 0.02 10*3/uL (ref 0.00–0.07)
Basophils Absolute: 0.1 10*3/uL (ref 0.0–0.1)
Basophils Relative: 1 %
Eosinophils Absolute: 0.3 10*3/uL (ref 0.0–0.5)
Eosinophils Relative: 4 %
HCT: 37.8 % — ABNORMAL LOW (ref 39.0–52.0)
Hemoglobin: 12.5 g/dL — ABNORMAL LOW (ref 13.0–17.0)
Immature Granulocytes: 0 %
Lymphocytes Relative: 31 %
Lymphs Abs: 2.2 10*3/uL (ref 0.7–4.0)
MCH: 31.6 pg (ref 26.0–34.0)
MCHC: 33.1 g/dL (ref 30.0–36.0)
MCV: 95.5 fL (ref 80.0–100.0)
Monocytes Absolute: 0.8 10*3/uL (ref 0.1–1.0)
Monocytes Relative: 11 %
Neutro Abs: 3.8 10*3/uL (ref 1.7–7.7)
Neutrophils Relative %: 53 %
Platelets: 148 10*3/uL — ABNORMAL LOW (ref 150–400)
RBC: 3.96 MIL/uL — ABNORMAL LOW (ref 4.22–5.81)
RDW: 12.7 % (ref 11.5–15.5)
WBC: 7.2 10*3/uL (ref 4.0–10.5)
nRBC: 0 % (ref 0.0–0.2)

## 2023-05-22 LAB — LIPASE, BLOOD: Lipase: 28 U/L (ref 11–51)

## 2023-05-22 LAB — COMPREHENSIVE METABOLIC PANEL
ALT: 11 U/L (ref 0–44)
AST: 20 U/L (ref 15–41)
Albumin: 3.6 g/dL (ref 3.5–5.0)
Alkaline Phosphatase: 64 U/L (ref 38–126)
Anion gap: 8 (ref 5–15)
BUN: 16 mg/dL (ref 8–23)
CO2: 24 mmol/L (ref 22–32)
Calcium: 8.9 mg/dL (ref 8.9–10.3)
Chloride: 108 mmol/L (ref 98–111)
Creatinine, Ser: 1.28 mg/dL — ABNORMAL HIGH (ref 0.61–1.24)
GFR, Estimated: 55 mL/min — ABNORMAL LOW (ref >60)
Glucose, Bld: 102 mg/dL — ABNORMAL HIGH (ref 70–99)
Potassium: 4 mmol/L (ref 3.5–5.1)
Sodium: 140 mmol/L (ref 135–145)
Total Bilirubin: 0.7 mg/dL (ref <1.2)
Total Protein: 6.3 g/dL — ABNORMAL LOW (ref 6.5–8.1)

## 2023-05-22 LAB — BRAIN NATRIURETIC PEPTIDE: B Natriuretic Peptide: 779 pg/mL — ABNORMAL HIGH (ref 0.0–100.0)

## 2023-05-22 LAB — MAGNESIUM: Magnesium: 2 mg/dL (ref 1.7–2.4)

## 2023-05-22 LAB — TROPONIN I (HIGH SENSITIVITY): Troponin I (High Sensitivity): 29 ng/L — ABNORMAL HIGH (ref <18)

## 2023-05-22 LAB — CBG MONITORING, ED: Glucose-Capillary: 77 mg/dL (ref 70–99)

## 2023-05-22 MED ORDER — ESMOLOL BOLUS VIA INFUSION
500.0000 ug/kg | Freq: Once | INTRAVENOUS | Status: DC
  Filled 2023-05-22: qty 37000

## 2023-05-22 MED ORDER — ONDANSETRON HCL 4 MG/2ML IJ SOLN
4.0000 mg | Freq: Once | INTRAMUSCULAR | Status: AC
  Administered 2023-05-22: 4 mg via INTRAVENOUS
  Filled 2023-05-22: qty 2

## 2023-05-22 MED ORDER — DEXTROSE 50 % IV SOLN
INTRAVENOUS | Status: AC | PRN
  Administered 2023-05-22: 1 via INTRAVENOUS

## 2023-05-22 MED ORDER — ESMOLOL HCL-SODIUM CHLORIDE 2000 MG/100ML IV SOLN: 100.0000 ug/kg/min | INTRAVENOUS | Status: DC

## 2023-05-22 MED ORDER — SODIUM BICARBONATE 8.4 % IV SOLN
INTRAVENOUS | Status: AC | PRN
  Administered 2023-05-22 (×2): 50 meq via INTRAVENOUS

## 2023-05-22 MED ORDER — MAGNESIUM SULFATE 50 % IJ SOLN
INTRAMUSCULAR | Status: AC | PRN
  Administered 2023-05-22: 2 g via INTRAVENOUS

## 2023-05-22 MED ORDER — LIDOCAINE HCL (CARDIAC) PF 100 MG/5ML IV SOSY
PREFILLED_SYRINGE | INTRAVENOUS | Status: AC | PRN
  Administered 2023-05-22: 100 mg via INTRAVENOUS

## 2023-05-22 MED ORDER — ESMOLOL HCL-SODIUM CHLORIDE 2000 MG/100ML IV SOLN
INTRAVENOUS | Status: AC
  Filled 2023-05-22: qty 100

## 2023-05-22 MED ORDER — ESMOLOL HCL-SODIUM CHLORIDE 2000 MG/100ML IV SOLN
500.0000 ug/kg/min | Freq: Once | INTRAVENOUS | Status: DC
  Administered 2023-05-22: 500 ug/kg/min via INTRAVENOUS

## 2023-05-22 MED ORDER — ESMOLOL HCL-SODIUM CHLORIDE 2000 MG/100ML IV SOLN
500.0000 ug/kg/min | Freq: Once | INTRAVENOUS | Status: DC
  Filled 2023-05-22: qty 100

## 2023-05-22 MED ORDER — AMIODARONE HCL 150 MG/3ML IV SOLN
INTRAVENOUS | Status: AC | PRN
  Administered 2023-05-22: 300 mg via INTRAVENOUS
  Administered 2023-05-22: 150 mg via INTRAVENOUS

## 2023-05-22 MED ORDER — CALCIUM CHLORIDE 10 % IV SOLN
INTRAVENOUS | Status: AC | PRN
  Administered 2023-05-22: 1 g via INTRAVENOUS

## 2023-05-22 MED ORDER — MORPHINE SULFATE (PF) 4 MG/ML IV SOLN
4.0000 mg | Freq: Once | INTRAVENOUS | Status: AC
  Administered 2023-05-22: 4 mg via INTRAVENOUS
  Filled 2023-05-22: qty 1

## 2023-05-22 MED ORDER — EPINEPHRINE 1 MG/10ML IJ SOSY
PREFILLED_SYRINGE | INTRAMUSCULAR | Status: AC | PRN
  Administered 2023-05-22 (×6): 1 mg via INTRAVENOUS

## 2023-05-22 MED ORDER — ESMOLOL HCL-SODIUM CHLORIDE 2000 MG/100ML IV SOLN: 500.0000 ug/kg/min | INTRAVENOUS | Status: DC

## 2023-05-22 MED ORDER — SODIUM CHLORIDE 0.9 % IV BOLUS
1000.0000 mL | Freq: Once | INTRAVENOUS | Status: AC
  Administered 2023-05-22: 1000 mL via INTRAVENOUS

## 2023-05-22 MED ORDER — EPINEPHRINE 1 MG/10ML IJ SOSY
PREFILLED_SYRINGE | INTRAMUSCULAR | Status: AC | PRN
  Administered 2023-05-22 (×2): 1 mg via INTRAVENOUS

## 2023-06-10 NOTE — Code Documentation (Signed)
No cardiac activity seen on bedside US by Dr. Wallace Cullens.

## 2023-06-10 NOTE — Code Documentation (Addendum)
Pt intubated by Dr. Wallace Cullens with size 8 ETT, secured at 24cm at the lip. Positive color change on CO2 detector.

## 2023-06-10 NOTE — ED Notes (Signed)
Pts body was cleaned, iv's removed, wires removed, gown applied. Placed in body bag with all appropriate body tags attached. Belongings with pt. Security escorted to [place] and logged onto sheet.

## 2023-06-10 NOTE — Progress Notes (Signed)
   06/01/2023 1610  Spiritual Encounters  Type of Visit Initial  Conversation partners present during encounter Nurse  Referral source Other (comment) [facility] call)  Reason for visit Patient death   Chaplain was at [facility] and responded to call from [facility] regarding a death in the ED.  Chaplain arrived in [place] ED at 0920.  Family had already left the bedside.

## 2023-06-10 NOTE — ED Provider Notes (Signed)
[place] EM[facility] DEPARTMENT AT [facility] Provider Note  CSN: [id number] Arrival date & time: 05/19/2023 4098  Chief Complaint(s) Chest Pain  HPI Jahvion Hislop Sebree is a 87 y.o. male with past medical history as below, significant for COPD, AAA, CAD, CABG 2002, HLD, HTN who presents to the ED with complaint of chest pain  Pt with chest pain awakened him from sleep around 0600, took ntg prior to EMS arrival w/o much improvement. Pain squeezing, midsternal, not radiating. Felt diaphoretic initially but that has resolved.  No dyspnea, nausea or syncope.  Has not yet taken his morning medications.  Went to bed last night felt okay.  No recent diet or medication changes.  No illicit drug use.  No associated dyspnea or leg swelling or unexpected weight changes  Past Medical History Past Medical History:  Diagnosis Date   Abdominal aortic aneurysm (HCC)    COPD (chronic obstructive pulmonary disease) (HCC)    Coronary atherosclerosis of native coronary artery    a. s/p CABG in 2002 b. cath in 11/2019 showing patent LIMA-LAD, patent SVG-D1, CTO of SVG-PDA and stenosis along SVG-OM1 wih collaterals noted and med management recommended.    Essential hypertension    Hyperlipidemia    Mixed hyperlipidemia    Patient Active Problem List   Diagnosis Date Noted   NSTEMI (non-ST elevated myocardial infarction) (HCC) 12/01/2019   Coronary atherosclerosis of native coronary artery 12/12/2012   Essential hypertension, benign 12/12/2012   Hyperlipidemia 12/12/2012   Diastolic dysfunction 12/12/2012   AAA (abdominal aortic aneurysm) (HCC) 12/12/2012   Home Medication(s) Prior to Admission medications   Medication Sig Start Date End Date Taking? Authorizing Provider  clopidogrel (PLAVIX) 75 MG tablet Take 1 tablet (75 mg total) by mouth daily with breakfast. 12/04/19  Yes Jodelle Red, MD  nitroGLYCERIN (NITROSTAT) 0.4 MG SL tablet Place 1 tablet (0.4 mg total) under the tongue every  5 (five) minutes as needed for chest pain. 12/03/19  Yes Jodelle Red, MD  aspirin 81 MG tablet Take 81 mg by mouth daily.    [provider]  atorvastatin (LIPITOR) 80 MG tablet Take 1 tablet (80 mg total) by mouth daily. 12/04/19   Jodelle Red, MD  finasteride (PROSCAR) 5 MG tablet Take 5 mg by mouth daily.    [provider]  isosorbide mononitrate (IMDUR) 30 MG 24 hr tablet TAKE 1 TABLET(30 MG) BY MOUTH DAILY 04/26/22   Jonelle Sidle, MD  metoprolol succinate (TOPROL XL) 25 MG 24 hr tablet Take 0.5 tablets (12.5 mg total) by mouth daily. 12/01/20   Jonelle Sidle, MD  pantoprazole (PROTONIX) 20 MG tablet TAKE 1 TABLET EVERY DAY 03/23/21   Jonelle Sidle, MD  terazosin (HYTRIN) 5 MG capsule Take 5 mg by mouth at bedtime.    [provider]  Past Surgical History Past Surgical History:  Procedure Laterality Date   CATARACT EXTRACTION W/PHACO Right 05/18/2014   Procedure: CATARACT EXTRACTION PHACO AND INTRAOCULAR LENS PLACEMENT (IOC);  Surgeon: Gemma Payor, MD;  Location: [facility];  Service: Ophthalmology;  Laterality: Right;  CDE 19.59   CATARACT EXTRACTION W/PHACO Left 06/01/2014   Procedure: CATARACT EXTRACTION PHACO AND INTRAOCULAR LENS PLACEMENT LEFT EYE;  Surgeon: Gemma Payor, MD;  Location: [facility];  Service: Ophthalmology;  Laterality: Left;  CDE 19.42   CORONARY ARTERY BYPASS GRAFT  11/09/00   LIMA to LAD, SVG OM and PLB   HERNIA REPAIR  1969   LEFT HEART CATH AND CORS/GRAFTS ANGIOGRAPHY N/A 12/01/2019   Procedure: LEFT HEART CATH AND CORS/GRAFTS ANGIOGRAPHY;  Surgeon: Yvonne Kendall, MD;  Location: [facility];  Service: Cardiovascular;  Laterality: N/A;   TONSILLECTOMY  Age 66   Family History Family History  Problem Relation Age of Onset   Sudden death Mother    Thrombosis Father    Cancer  Sister    Sudden death Maternal Grandmother    Kidney disease Maternal Grandfather     Social History Social History   Tobacco Use   Smoking status: Former    Current packs/day: 0.00    Types: Cigarettes    Start date: 07/11/1963    Quit date: 07/10/1988    Years since quitting: 34.8   Smokeless tobacco: Never  Vaping Use   Vaping status: Never Used  Substance Use Topics   Alcohol use: No    Alcohol/week: 0.0 standard drinks of alcohol   Drug use: No   Allergies Other  Review of Systems Review of Systems  Constitutional:  Positive for diaphoresis. Negative for chills and fever.  Respiratory:  Positive for chest tightness. Negative for shortness of breath.   Cardiovascular:  Positive for chest pain. Negative for palpitations.  Gastrointestinal:  Negative for nausea.  Musculoskeletal:  Negative for neck stiffness.  Skin:  Negative for rash and wound.  Neurological:  Negative for weakness and numbness.  All other systems reviewed and are negative.   Physical Exam Vital Signs  I have reviewed the triage vital signs BP (!) 124/94 (BP Location: Right Arm)   Pulse (!) 120   Temp 97.9 F (36.6 C) (Oral)   Resp 13   SpO2 96%  Physical Exam Vitals and nursing note reviewed.  Constitutional:      General: He is not in acute distress.    Appearance: Normal appearance. He is well-developed. He is not ill-appearing.  HENT:     Head: Normocephalic and atraumatic.     Right Ear: External ear normal.     Left Ear: External ear normal.     Mouth/Throat:     Mouth: Mucous membranes are moist.  Eyes:     General: No scleral icterus. Cardiovascular:     Rate and Rhythm: Tachycardia present. Rhythm irregular.     Pulses: Normal pulses.     Heart sounds: Normal heart sounds.  Pulmonary:     Effort: Pulmonary effort is normal. No respiratory distress.     Breath sounds: Normal breath sounds.  Abdominal:     General: Abdomen is flat.     Palpations: Abdomen is soft.      Tenderness: There is no abdominal tenderness.  Musculoskeletal:     Cervical back: No rigidity.     Right lower leg: No edema.     Left lower leg: No edema.  Skin:    General: Skin  is warm and dry.     Capillary Refill: Capillary refill takes less than 2 seconds.  Neurological:     Mental Status: He is alert and oriented to person, place, and time.     GCS: GCS eye subscore is 4. GCS verbal subscore is 5. GCS motor subscore is 6.  Psychiatric:        Mood and Affect: Mood normal.        Behavior: Behavior normal.     ED Results and Treatments Labs (all labs ordered are listed, but only abnormal results are displayed) Labs Reviewed  CBC WITH DIFFERENTIAL/PLATELET - Abnormal; Notable for the following components:      Result Value   RBC 3.96 (*)    Hemoglobin 12.5 (*)    HCT 37.8 (*)    Platelets 148 (*)    All other components within normal limits  COMPREHENSIVE METABOLIC PANEL - Abnormal; Notable for the following components:   Glucose, Bld 102 (*)    Creatinine, Ser 1.28 (*)    Total Protein 6.3 (*)    GFR, Estimated 55 (*)    All other components within normal limits  BRAIN NATRIURETIC PEPTIDE - Abnormal; Notable for the following components:   B Natriuretic Peptide 779.0 (*)    All other components within normal limits  TROPONIN I (HIGH SENSITIVITY) - Abnormal; Notable for the following components:   Troponin I (High Sensitivity) 29 (*)    All other components within normal limits  LIPASE, BLOOD  MAGNESIUM  CBG MONITORING, ED                                                                                                                          Radiology DG Chest Port 1 View  Result Date: 05/14/2023 CLINICAL DATA:  Chest pain. EXAM: PORTABLE CHEST 1 VIEW COMPARISON:  12/01/2019. FINDINGS: Redemonstration of nonspecific opacities throughout bilateral lungs, which are essentially similar to the prior study favored to represent fibrosis/scarring. Bilateral lung fields  are otherwise clear. No acute consolidation or lung collapse. No pulmonary edema. Bilateral costophrenic angles are clear. Normal cardio-mediastinal silhouette. There are surgical staples along the heart border and sternotomy wires, status post CABG (coronary artery bypass graft). No acute osseous abnormalities. The soft tissues are within normal limits. IMPRESSION: *No acute cardiopulmonary abnormality. Electronically Signed   By: Jules Schick M.D.   On: 05/23/2023 08:26    Pertinent labs & imaging results that were available during my care of the patient were reviewed by me and considered in my medical decision making (see MDM for details).  Medications Ordered in ED Medications  esmolol (BREVIBLOC) 2000 mg / 100 mL (20 mg/mL) infusion (has no administration in time range)  esmolol (BREVIBLOC) bolus via infusion 36,400 mcg (has no administration in time range)  morphine (PF) 4 MG/ML injection 4 mg (4 mg Intravenous Given 05/29/2023 0734)  ondansetron (ZOFRAN) injection 4 mg (4 mg Intravenous Given 05/18/2023 0734)  sodium chloride 0.9 % bolus  1,000 mL (0 mLs Intravenous Stopped 05/16/2023 0852)  EPINEPHrine (ADRENALIN) 1 MG/10ML injection (1 mg Intravenous Given 05/29/2023 0838)  calcium chloride injection (1 g Intravenous Given 06/04/2023 0804)  sodium bicarbonate injection (50 mEq Intravenous Given 05/25/2023 0825)  EPINEPHrine (ADRENALIN) 1 MG/10ML injection (1 mg Intravenous Given 06/01/2023 0823)  dextrose 50 % solution (1 ampule Intravenous Given 05/14/2023 0807)  amiodarone (CORDARONE) injection (150 mg Intravenous Given 05/20/2023 0833)  magnesium sulfate (IV Push/IM) injection (2 g Intravenous Given 05/17/2023 0818)  lidocaine (cardiac) 100 mg/39mL (XYLOCAINE) injection 2% (100 mg Intravenous Given 05/18/2023 0823)                                                                                                                                     Procedures CPR  Date/Time: 06/06/2023 9:07 AM  Performed  by: Sloan Leiter, DO Authorized by: Sloan Leiter, DO  CPR Procedure Details:    ACLS/BLS initiated by EMS: No     CPR/ACLS performed in the ED: Yes     Duration of CPR (minutes):  50   Outcome: Pt declared dead    CPR performed via ACLS guidelines under my direct supervision.  See RN documentation for details including defibrillator use, medications, doses and timing. Procedure Name: Intubation Date/Time: 05/13/2023 9:07 AM  Performed by: Sloan Leiter, DOPre-anesthesia Checklist: Patient identified, Patient being monitored, Emergency Drugs available, Timeout performed and Suction available Oxygen Delivery Method: Non-rebreather mask Preoxygenation: Pre-oxygenation with 100% oxygen Ventilation: Mask ventilation without difficulty Laryngoscope Size: Glidescope Grade View: Grade I Tube size: 8.0 mm Number of attempts: 1 Placement Confirmation: ETT inserted through vocal cords under direct vision and Breath sounds checked- equal and bilateral Secured at: 23 cm Tube secured with: ETT holder Dental Injury: Teeth and Oropharynx as per pre-operative assessment  Difficulty Due To: Difficulty was unanticipated    .Critical Care  Performed by: Sloan Leiter, DO Authorized by: Sloan Leiter, DO   Critical care provider statement:    Critical care time (minutes):  30   Critical care time was exclusive of:  Separately billable procedures and treating other patients   Critical care was necessary to treat or prevent imminent or life-threatening deterioration of the following conditions:  Cardiac failure   Critical care was time spent personally by me on the following activities:  Development of treatment plan with patient or surrogate, discussions with consultants, evaluation of patient's response to treatment, examination of patient, ordering and review of laboratory studies, ordering and review of radiographic studies, ordering and performing treatments and interventions, pulse oximetry,  re-evaluation of patient's condition, review of old charts and obtaining history from patient or surrogate   (including critical care time)  Medical Decision Making / ED Course    Medical Decision Making:    Ricardo Castillo is a 87 y.o. male  with past medical history as below, significant for COPD, AAA, CAD, CABG 2002,  HLD, HTN who presents to the ED with complaint of chest pain. The complaint involves an extensive differential diagnosis and also carries with it a high risk of complications and morbidity.  Serious etiology was considered. Ddx includes but is not limited to: .Differential includes all life-threatening causes for chest pain. This includes but is not exclusive to acute coronary syndrome, aortic dissection, pulmonary embolism, cardiac tamponade, community-acquired pneumonia, pericarditis, musculoskeletal chest wall pain, etc.    Complete initial physical exam performed, notably the patient  was HR elevated, o/w well appearing, ongoing pain. Took NTG and ASA pta.  Reviewed and confirmed nursing documentation for past medical history, family history, social history.  Vital signs reviewed.      Dr Diona Browner primary cardiology  HFrEF EF 40 to 45% 5/21  Onset of chest pain at rest, mild improvement with nitroglycerin.  Concern for at the very least unstable angina.  Patient has multiple cardiovascular risk factors.  Patient also has apparent new onset atrial fibrillation. CHADSVASC 4. Unclear timing of the afib but possible onset this AM with start the pain. Recommend anticoagulation. Recommend admission, will d/w cardiology     I was called to bedside as pt had precipitous drop in his blood pressure and with worsening chest pain. Pulses were lost and ACLS protocol was initiated. Pt was intubated during arrest successfully, see note. He had multiple runs of vfib and received defibrillation multiple times. He had brief ROSC with apparent vtach on telemetry but unfortunately  decompensated back to pulseless vfib after around 15-20 seconds. ACLS protocol was followed and unfortunately patient did not survive and was pronounced deceased at 8:52 AM. Asystole on telemetry, asystole on bedside echo, no carotid/femoral pulse, no spontaneous respirations. Daughter at bedside. This is not a ME case. PCP notified Dr Margo Aye for death certificate.               Additional history obtained: -Additional history obtained from na -External records from outside source obtained and reviewed including: Chart review including previous notes, labs, imaging, consultation notes including  Prior echo Home medications    Lab Tests: -I ordered, reviewed, and interpreted labs.   The pertinent results include:   Labs Reviewed  CBC WITH DIFFERENTIAL/PLATELET - Abnormal; Notable for the following components:      Result Value   RBC 3.96 (*)    Hemoglobin 12.5 (*)    HCT 37.8 (*)    Platelets 148 (*)    All other components within normal limits  COMPREHENSIVE METABOLIC PANEL - Abnormal; Notable for the following components:   Glucose, Bld 102 (*)    Creatinine, Ser 1.28 (*)    Total Protein 6.3 (*)    GFR, Estimated 55 (*)    All other components within normal limits  BRAIN NATRIURETIC PEPTIDE - Abnormal; Notable for the following components:   B Natriuretic Peptide 779.0 (*)    All other components within normal limits  TROPONIN I (HIGH SENSITIVITY) - Abnormal; Notable for the following components:   Troponin I (High Sensitivity) 29 (*)    All other components within normal limits  LIPASE, BLOOD  MAGNESIUM  CBG MONITORING, ED    Notable for trop mild elev, BNP elev  EKG   EKG Interpretation Date/Time:  Tuesday May 22 2023 07:05:26 EST Ventricular Rate:  122 PR Interval:    QRS Duration:  121 QT Interval:  320 QTC Calculation: 456 R Axis:   81  Text Interpretation: Atrial fibrillation Nonspecific intraventricular conduction delay Anterior infarct, old  Repol abnrm suggests ischemia, anterolateral Confirmed by Tanda Rockers ([id number]) on 05/26/2023 7:18:52 AM         Imaging Studies ordered: I ordered imaging studies including CXR I independently visualized the following imaging with scope of interpretation limited to determining acute life threatening conditions related to emergency care; findings noted above I independently visualized and interpreted imaging. I agree with the radiologist interpretation   Medicines ordered and prescription drug management: Meds ordered this encounter  Medications   morphine (PF) 4 MG/ML injection 4 mg   ondansetron (ZOFRAN) injection 4 mg   sodium chloride 0.9 % bolus 1,000 mL   EPINEPHrine (ADRENALIN) 1 MG/10ML injection   calcium chloride injection   sodium bicarbonate injection   EPINEPHrine (ADRENALIN) 1 MG/10ML injection   dextrose 50 % solution   amiodarone (CORDARONE) injection   magnesium sulfate (IV Push/IM) injection   lidocaine (cardiac) 100 mg/80mL (XYLOCAINE) injection 2%   DISCONTD: esmolol (BREVIBLOC) 2000 mg / 100 mL (20 mg/mL) infusion   DISCONTD: esmolol (BREVIBLOC) 2000 mg / 100 mL (20 mg/mL) infusion   DISCONTD: esmolol (BREVIBLOC) 2000 mg / 100 mL (20 mg/mL) infusion   esmolol (BREVIBLOC) 2000 mg / 100 mL (20 mg/mL) infusion    Wandra Mannan R: cabinet override   esmolol (BREVIBLOC) 2000 mg / 100 mL (20 mg/mL) infusion   esmolol (BREVIBLOC) bolus via infusion 36,400 mcg    -I have reviewed the patients home medicines and have made adjustments as needed   Consultations Obtained: na   Cardiac Monitoring: The patient was maintained on a cardiac monitor.  I personally viewed and interpreted the cardiac monitored which showed an underlying rhythm of: afib Continuous pulse oximetry interpreted by myself, 97% on RA.    Social Determinants of Health:  Diagnosis or treatment significantly limited by social determinants of health: former smoker   Reevaluation: After the  interventions noted above, I reevaluated the patient and found that they have worsened  Co morbidities that complicate the patient evaluation  Past Medical History:  Diagnosis Date   Abdominal aortic aneurysm (HCC)    COPD (chronic obstructive pulmonary disease) (HCC)    Coronary atherosclerosis of native coronary artery    a. s/p CABG in 2002 b. cath in 11/2019 showing patent LIMA-LAD, patent SVG-D1, CTO of SVG-PDA and stenosis along SVG-OM1 wih collaterals noted and med management recommended.    Essential hypertension    Hyperlipidemia    Mixed hyperlipidemia       Dispostion: Disposition decision including need for hospitalization was considered, and patient Expired    Final Clinical Impression(s) / ED Diagnoses Final diagnoses:  New onset atrial fibrillation [facility])  Cardiac arrest (HCC)        Sloan Leiter, DO 05/24/2023 0915

## 2023-06-10 NOTE — Code Documentation (Signed)
Linton Rump applied to pt

## 2023-06-10 NOTE — Code Documentation (Signed)
Family updated as to patient's status by Dr. Pearline Cables ?

## 2023-06-10 NOTE — Code Documentation (Signed)
Pulse check-no pulses felt. Cardiac monitor showing V. Fib. Shock given at 200J.

## 2023-06-10 NOTE — ED Notes (Signed)
Family left bedside.

## 2023-06-10 NOTE — Code Documentation (Signed)
Patient time of death occurred at 44. Called by Dr. Wallace Cullens.

## 2023-06-10 NOTE — ED Notes (Signed)
At 0800, RN at bedside with patient. Pt c/o of increased chest pain, EDP notified and order another bolus. RN hanging additional bolus when patient c/o of his chest pain worsening. Pt went into VFIB. CPR started. Assistance came to room. Code initiated.

## 2023-06-10 NOTE — ED Notes (Signed)
ED Provider at bedside. 

## 2023-06-10 NOTE — ED Triage Notes (Signed)
Pt from home with daughter. Stated having chest pain that woke him up at 0600. States it has a squeezing pain.   Took two nitros prior to EMS. Hypotensive on scene. No meds given with EMS.

## 2023-06-10 NOTE — ED Notes (Signed)
Family at bedside. Information obtained by patient.

## 2023-06-10 DEATH — deceased
